# Patient Record
Sex: Male | Born: 1980
Health system: Southern US, Community
[De-identification: ages and names within clinical notes are randomized; demographics above are authoritative.]

## PROBLEM LIST (undated history)

## (undated) DIAGNOSIS — F419 Anxiety disorder, unspecified: Secondary | ICD-10-CM

## (undated) DIAGNOSIS — I10 Essential (primary) hypertension: Secondary | ICD-10-CM

## (undated) HISTORY — PX: TONSILLECTOMY: SHX5217

## (undated) HISTORY — DX: Anxiety disorder, unspecified: F41.9

## (undated) HISTORY — PX: BACK SURGERY: SHX140

---
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
## 2007-11-14 ENCOUNTER — Ambulatory Visit: Payer: Self-pay | Admitting: Occupational Medicine

## 2007-11-14 DIAGNOSIS — F411 Generalized anxiety disorder: Secondary | ICD-10-CM | POA: Insufficient documentation

## 2007-11-18 ENCOUNTER — Ambulatory Visit: Payer: Self-pay | Admitting: Family Medicine

## 2007-11-18 DIAGNOSIS — R1011 Right upper quadrant pain: Secondary | ICD-10-CM | POA: Insufficient documentation

## 2007-11-18 DIAGNOSIS — R030 Elevated blood-pressure reading, without diagnosis of hypertension: Secondary | ICD-10-CM | POA: Insufficient documentation

## 2007-11-19 LAB — CONVERTED CEMR LAB
ALT: 12 units/L (ref 0–35)
AST: 19 units/L (ref 0–37)
Albumin: 5 g/dL (ref 3.5–5.2)
Alkaline Phosphatase: 61 units/L (ref 39–117)
BUN: 22 mg/dL (ref 6–23)
CO2: 24 meq/L (ref 19–32)
Calcium: 9.7 mg/dL (ref 8.4–10.5)
Chloride: 104 meq/L (ref 96–112)
Creatinine, Ser: 1.05 mg/dL (ref 0.40–1.20)
Glucose, Bld: 94 mg/dL (ref 70–99)
Potassium: 4.5 meq/L (ref 3.5–5.3)
Sodium: 139 meq/L (ref 135–145)
TSH: 1.685 microintl units/mL (ref 0.350–4.50)
Total Bilirubin: 0.8 mg/dL (ref 0.3–1.2)
Total Protein: 7.7 g/dL (ref 6.0–8.3)

## 2008-03-09 ENCOUNTER — Ambulatory Visit: Payer: Self-pay | Admitting: Family Medicine

## 2008-03-09 DIAGNOSIS — G5600 Carpal tunnel syndrome, unspecified upper limb: Secondary | ICD-10-CM | POA: Insufficient documentation

## 2008-05-23 ENCOUNTER — Ambulatory Visit: Payer: Self-pay | Admitting: Family Medicine

## 2008-12-23 ENCOUNTER — Ambulatory Visit: Payer: Self-pay | Admitting: Family Medicine

## 2008-12-23 DIAGNOSIS — J019 Acute sinusitis, unspecified: Secondary | ICD-10-CM | POA: Insufficient documentation

## 2009-01-11 ENCOUNTER — Ambulatory Visit: Payer: Self-pay | Admitting: Family Medicine

## 2009-01-11 DIAGNOSIS — A088 Other specified intestinal infections: Secondary | ICD-10-CM | POA: Insufficient documentation

## 2009-02-04 ENCOUNTER — Ambulatory Visit: Payer: Self-pay | Admitting: Family Medicine

## 2009-02-04 DIAGNOSIS — M545 Low back pain, unspecified: Secondary | ICD-10-CM | POA: Insufficient documentation

## 2009-02-24 ENCOUNTER — Encounter: Payer: Self-pay | Admitting: Family Medicine

## 2009-05-30 ENCOUNTER — Ambulatory Visit: Payer: Self-pay | Admitting: Family Medicine

## 2009-05-30 DIAGNOSIS — H6690 Otitis media, unspecified, unspecified ear: Secondary | ICD-10-CM | POA: Insufficient documentation

## 2009-05-30 DIAGNOSIS — J0100 Acute maxillary sinusitis, unspecified: Secondary | ICD-10-CM | POA: Insufficient documentation

## 2010-01-31 NOTE — Assessment & Plan Note (Signed)
Summary: Left carpel tunnel.    Vital Signs:  Patient profile:   30 year old male Height:      72 inches Weight:      242 pounds Pulse rate:   73 / minute BP sitting:   138 / 75  (left arm) Cuff size:   large  Vitals Entered By: Kathlene November (March 09, 2008 4:08 PM) Is Patient Diabetic? No   History of Present Illness: Left wrist pain/numbness. Numbness worse at night. Goes niumb with lifting heavy laundry bags during the day. Affects sometimes the finger tips a nd sometimes the whole hand.  No alleviating sxs. Started about 2 weeks ago.  Getting worse.  No  OTC medication.  No injuries or trauma. No prior hx of similar sxs. Right handed. No shoulder, neck or elbow pain. No swelling of the joints.  No HA or other neurologic signs.  No CP.    Current Medications (verified): 1)  None  Allergies (verified): No Known Drug Allergies  Physical Exam  General:  Well-developed,well-nourished,in no acute distress; alert,appropriate and cooperative throughout examination Head:  Normocephalic and atraumatic without obvious abnormalities. No apparent alopecia or balding. Eyes:  No corneal or conjunctival inflammation noted. EOMI. Perrla. Funduscopic exam benign, without hemorrhages, exudates or papilledema. Vision grossly normal. Msk:  Left wrsit with NROM.  Strength 5/5 in the wrist, fingers, elbow, and shoulder.  Neck with NROMl.  Shoulder with NROM. Elbow and wrist are nontender. Neg tinnels sign.  + phalens sign, caused numbnes in his fingertips on the left. NO swelling or rednesss of the joints.  Pulses:  Radial 2+ bilat.  Extremities:  No jiont swelling or erythema.  Neurologic:  alert & oriented X3 and gait normal.   Skin:  no rashes.   Psych:  Cognition and judgment appear intact. Alert and cooperative with normal attention span and concentration. No apparent delusions, illusions, hallucinations   Impression & Recommendations:  Problem # 1:  CARPAL TUNNEL SYNDROME, LEFT  (ICD-354.0) Assessment New Discussed treatment with splints at night. Ibuprofen 400mg  three times a day for one week. FU with Dr. B in 3 weeks to recheck and see if he is improving.  Discussed dx with him. Fitted for an cock up splint -XL.   Orders: Wrist Splint Cock Up [phone])

## 2010-01-31 NOTE — Letter (Signed)
Summary: Disclosure of medical information  Disclosure of medical information   Imported By: Shelbie Proctor 02/24/2009 16:54:10  _____________________________________________________________________  External Attachment:    Type:   Image     Comment:   External Document

## 2010-01-31 NOTE — Letter (Signed)
Summary: Out of Work  [facility]  [address]   [place], [place] [address]   Phone: [phone]  Fax: [phone]    February 04, 2009   Employee:  [employer]    To Whom It May Concern:   For Medical reasons, please excuse the above named employee from heavy lifting, pushing, pulling, and straining for one week (light duty).    If you need additional information, please feel free to contact our office.         Sincerely,    Donna Christen MD

## 2010-01-31 NOTE — Assessment & Plan Note (Signed)
Summary: CONGESTION, HEAD PRESSURE, BODY ACHES, FEVER, SORE THROAT   Vital Signs:  Patient Profile:   30 Years Old Male CC:      congestion, head pressusre, body aches, fever, sore throat Height:     72 inches Weight:      225.5 pounds O2 Sat:      96 % Temp:     99.5 degrees F oral Pulse rate:   106 / minute BP sitting:   141 / 83  (left arm) Cuff size:   regular  Vitals Entered By: Angie Fava (May 23, 2008 1:11 PM)                  Updated Prior Medication List: No Medications Current Allergies: No known allergies  History of Present Illness Chief Complaint: congestion, head pressusre, body aches, fever, sore throat History of Present Illness: ONSET  YESTERDAY WITH SCRATCHY THROAT. WORSENED OVERNIGHT. FEVER LOW GRADE. SINUS PRESSURE, OCC COUGH. NO EAR PAIN. DOES HAVE SOME BODYACHES.   REVIEW OF SYSTEMS Constitutional Symptoms       Complains of fever.     Denies chills, night sweats, weight loss, weight gain, and fatigue.  Eyes       Denies change in vision, eye pain, eye discharge, glasses, contact lenses, and eye surgery. Ear/Nose/Throat/Mouth       Complains of sore throat.      Denies hearing loss/aids, change in hearing, ear pain, ear discharge, dizziness, frequent runny nose, frequent nose bleeds, sinus problems, hoarseness, and tooth pain or bleeding.  Respiratory       Denies dry cough, productive cough, wheezing, shortness of breath, asthma, bronchitis, and emphysema/COPD.  Cardiovascular       Denies murmurs, chest pain, and tires easily with exhertion.    Gastrointestinal       Denies stomach pain, nausea/vomiting, diarrhea, constipation, blood in bowel movements, and indigestion. Genitourniary       Denies painful urination, kidney stones, and loss of urinary control. Neurological       Denies paralysis, seizures, and fainting/blackouts. Musculoskeletal       Complains of muscle pain.      Denies joint pain, joint stiffness, decreased range of  motion, redness, swelling, muscle weakness, and gout.  Skin       Denies bruising, unusual mles/lumps or sores, and hair/skin or nail changes.  Psych       Denies mood changes, temper/anger issues, anxiety/stress, speech problems, depression, and sleep problems.   Physical Exam General appearance: well developed, well nourished, no acute distress Head: normocephalic, atraumatic Eyes: conjunctivae and lids normal Ears: normal, no lesions or deformities Nasal: CONGESTED Oral/Pharynx: POSTERIOR PHARYNX INFLAMED Neck: neck supple,  trachea midline, no masses Chest/Lungs: no rales, wheezes, or rhonchi bilateral, breath sounds equal without effort Heart: regular rate and  rhythm, no murmur Skin: no obvious rashes or lesions    Assessment New Problems: UPPER RESPIRATORY INFECTION, ACUTE (ICD-465.9)   Plan New Medications/Changes: RONDEC DM 1-2 TSP by mouth Q 6 HRS as needed COUGH AND CONGESTION  #4 OZ x 0, 05/23/2008, Marvis Moeller DO AMOXICILLIN 500 MG 1 TAB by mouth three times a day  ##30 x 0, 05/23/2008, Marvis Moeller DO  New Orders: Est. Patient Level III [04540]     Prescriptions: RONDEC DM 1-2 TSP by mouth Q 6 HRS as needed COUGH AND CONGESTION  #4 OZ x 0   Entered and Authorized by:   Marvis Moeller DO   Signed by:   Marvis Moeller  DO on 05/23/2008   Method used:   Print then Give to Patient   RxID:   [id number] AMOXICILLIN 500 MG 1 TAB by mouth three times a day  ##30 x 0   Entered and Authorized by:   Marvis Moeller DO   Signed by:   Marvis Moeller DO on 05/23/2008   Method used:   Print then Give to Patient   RxID:   [id number]    Patient Instructions: 1)  TYLENOL OR MOTRIN AS NEEDED. AVOID CAFFEINE AND MILK PRODUCTS. FOLLOW UP WITH PCP OR RETURN IF SYMPTOMS FAIL TO RESOLVE OR WORSEN ] ]

## 2010-01-31 NOTE — Assessment & Plan Note (Signed)
Summary: RUNNY NOSE,SINUS Pressure, eye drainage x 3 dys rm 2   Vital Signs:  Patient Profile:   30 Years Old Male CC:      Cold & URI symptoms Height:     72 inches Weight:      250 pounds O2 Sat:      98 % O2 treatment:    Room Air Temp:     98.2 degrees F oral Pulse rate:   85 / minute Pulse rhythm:   regular Resp:     16 per minute BP sitting:   139 / 90  (right arm) Cuff size:   regular  Vitals Entered By: Areta Haber CMA (May 30, 2009 11:51 AM)                  Current Allergies: No known allergies History of Present Illness Chief Complaint: Cold & URI symptoms History of Present Illness: Subjective: Patient complains of URI symptoms that started 3 days ago with sinus congestion, and now has right earache and facial pressure. Minimal sore throat. No cough No pleuritic pain No wheezing + post-nasal drainage No itchy/red eyes No hemoptysis No SOB No fever/chills No nausea No vomiting No abdominal pain No diarrhea No skin rashes + fatigue No myalgias No headache Used OTC meds without relief   Current Problems: ACUTE MAXILLARY SINUSITIS (ICD-461.0) ROM (ICD-382.9) BACK PAIN, LUMBAR (ICD-724.2) GASTROENTERITIS, VIRAL, ACUTE (ICD-008.8) ACUTE SINUSITIS, UNSPECIFIED (ICD-461.9) CARPAL TUNNEL SYNDROME, LEFT (ICD-354.0) ABDOMINAL PAIN RIGHT UPPER QUADRANT (ICD-789.01) ELEVATED BLOOD PRESSURE WITHOUT DIAGNOSIS OF HYPERTENSION (ICD-796.2) ANXIETY (ICD-300.00)   Current Meds HYDROCODONE-ACETAMINOPHEN 5-500 MG TABS (HYDROCODONE-ACETAMINOPHEN) 1-2 as needed for pain from Back Surgery AMOXICILLIN 875 MG TABS (AMOXICILLIN) One by mouth two times a day BENZONATATE 200 MG CAPS (BENZONATATE) One by mouth hs as needed cough  REVIEW OF SYSTEMS Constitutional Symptoms      Denies fever, chills, night sweats, weight loss, weight gain, and fatigue.  Eyes       Complains of eye drainage.      Denies change in vision, eye pain, glasses, contact lenses, and eye  surgery. Ear/Nose/Throat/Mouth       Complains of frequent runny nose and sinus problems.      Denies hearing loss/aids, change in hearing, ear pain, ear discharge, dizziness, frequent nose bleeds, sore throat, hoarseness, and tooth pain or bleeding.      Comments: x 3 dys -clear Respiratory       Denies dry cough, productive cough, wheezing, shortness of breath, asthma, bronchitis, and emphysema/COPD.  Cardiovascular       Denies murmurs, chest pain, and tires easily with exhertion.    Gastrointestinal       Denies stomach pain, nausea/vomiting, diarrhea, constipation, blood in bowel movements, and indigestion. Genitourniary       Denies painful urination, kidney stones, and loss of urinary control. Neurological       Complains of headaches.      Denies paralysis, seizures, and fainting/blackouts. Musculoskeletal       Denies muscle pain, joint pain, joint stiffness, decreased range of motion, redness, swelling, muscle weakness, and gout.  Skin       Denies bruising, unusual mles/lumps or sores, and hair/skin or nail changes.  Psych       Denies mood changes, temper/anger issues, anxiety/stress, speech problems, depression, and sleep problems. Other Comments: Pt has not seen PCP for this.   Past History:  Past Medical History: Last updated: 11/18/2007 Anxiety Flex Sig normal 12-08  Past Surgical History:  Last updated: 2007-11-26 tonsillectomy age 27  Family History: Last updated: Nov 26, 2007 Mother died, complications of gastric bypass sister healthy father alive, DM, high chol, HTN MGF AMI > 31  Social History: Last updated: 02/04/2009 Married  Never Smoked Alcohol use-yes,socially Drug use-no Regular exercise-no has a toddler son weight increase 30 lbs in 2 yrs. 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  Extraocular movement is intact.  Conjunctivae are not inflamed.  Ears:  Canals normal.   Left tympanic membrane normal.  Right tympanic membrane red and bulging. Nose:  Normal septum.  Normal turbinates, mildly congested.  Right maxillary sinus tenderness present.  Pharynx:  Normal  Neck:  Supple.  No adenopathy is present.  No thyromegaly is present  Lungs:  Clear to auscultation.  Breath sounds are equal.  Heart:  Regular rate and rhythm without murmurs, rubs, or gallops.  Abdomen:  Nontender without masses or hepatosplenomegaly.  Bowel sounds are present.  No CVA or flank tenderness.  Assessment New Problems: ACUTE MAXILLARY SINUSITIS (ICD-461.0) ROM (ICD-382.9)  SUSPECT EARLY VIRAL URI  Plan New Medications/Changes: BENZONATATE 200 MG CAPS (BENZONATATE) One by mouth hs as needed cough  #12 x 0, 05/30/2009, Donna Christen MD AMOXICILLIN 875 MG TABS (AMOXICILLIN) One by mouth two times a day  #20 x 0, 05/30/2009, Donna Christen MD  New Orders: Est. Patient Level III [phone] Planning Comments:   Begin amoxicillin, decongestant/expectorant, cough suppressant at night. Follow-up with PCP if not improving.   The patient and/or caregiver has been counseled thoroughly with regard to medications prescribed including dosage, schedule, interactions, rationale for use, and possible side effects and they verbalize understanding.  Diagnoses and expected course of recovery discussed and will return if not improved as expected or if the condition worsens. Patient and/or caregiver verbalized understanding.  Prescriptions: BENZONATATE 200 MG CAPS (BENZONATATE) One by mouth hs as needed cough  #12 x 0   Entered and Authorized by:   Donna Christen MD   Signed by:   Donna Christen MD on 05/30/2009   Method used:   Print then Give to Patient   RxID:   [id number] AMOXICILLIN 875 MG TABS (AMOXICILLIN) One by mouth two times a day  #20 x 0    Entered and Authorized by:   Donna Christen MD   Signed by:   Donna Christen MD on 05/30/2009   Method used:   Print then Give to Patient   RxID:   [id number]   Patient Instructions: 1)  Take  Mucinex D (guaifenesin with decongestant) twice daily for congestion. 2)  Increase fluid intake, rest. 3)  May use Afrin nasal spray (or generic oxymetazoline) twice daily for about 5 days.  Also recommend using saline nasal spray several times daily and/or saline nasal irrigation. 4)  Stop Sinex  5)  Followup with family doctor if not improving one week.

## 2010-01-31 NOTE — Assessment & Plan Note (Signed)
Summary: BLOOD PRESSURE/KH   Vital Signs:  Patient Profile:   30 Years Old Male CC:      Hypertension Management Height:     72 inches Weight:      230 pounds O2 Sat:      99 % Temp:     98.7 degrees F Pulse rate:   70 / minute Resp:     16 per minute BP sitting:   158 / 93  (left arm) Cuff size:   regular  Pt. in pain?   yes    Location:   head    Intensity:   4    Type:       dull  Vitals Entered By: Areta Haber CMA (November 14, 2007 4:52 PM)                   Current Allergies: No known allergies  History of Present Illness Chief Complaint: Hypertension Management History of Present Illness: 30 yo male had an episode of near syncope earlier this week.  He stopped at an FD and had his BP taken 202/140 at that time.  He was taken to the [facility] regional ED and he was treated with lorazepam #3.  Pt was instructed to return to ED if he feels the way he did before.  Pt had an appointment today to see his PCM but missed the appointment because of wrong time.  Pt states he feels like his breathing changes during his work day.  Pt states he has a lot of stress with work.  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  Pt will feel 'twitching of his muscles' and flushing of his face at work.  Pt denies working with any chemicals.  States he feels a lot better during the weekends.  Pt denies any recent weight gain or loss.  Pt states he feels more 'wron out' than he has in the past.  Pt states he has no symptoms at this time and is looking for a primary care provide.     All: nkda   REVIEW OF SYSTEMS Constitutional Symptoms      Denies fever, chills, night sweats, weight loss, weight gain, and fatigue.  Eyes       Denies change in vision, eye pain, eye discharge, glasses, contact lenses, and eye surgery. Ear/Nose/Throat/Mouth       Denies hearing loss/aids, change in hearing, ear pain, ear discharge, dizziness, frequent runny nose, frequent  nose bleeds, sinus problems, sore throat, hoarseness, and tooth pain or bleeding.  Respiratory       Denies dry cough, productive cough, wheezing, shortness of breath, asthma, bronchitis, and emphysema/COPD.  Cardiovascular       Complains of chest pain.      Denies murmurs and tires easily with exhertion.    Gastrointestinal       Denies stomach pain, nausea/vomiting, diarrhea, constipation, blood in bowel movements, and indigestion. Genitourniary       Denies painful urination, kidney stones, and loss of urinary control. Neurological       Denies paralysis, seizures, and fainting/blackouts. Musculoskeletal       Denies muscle pain, joint pain, joint stiffness, decreased range of motion, redness, swelling, muscle weakness, and gout.  Skin       Denies bruising, unusual mles/lumps or sores, and hair/skin or nail changes.  Psych       Complains of anxiety/stress.      Denies mood changes, temper/anger issues,  speech problems, depression, and sleep problems.  Past Medical History:        Anxiety  Past Surgical History:    Denies surgical history   Family History:    Family History Hypertension    Family History of Cardiovascular disorder  Social History:    Married    Never Smoked    Alcohol use-yes,socially    Drug use-no    Regular exercise-yes  Physical Exam General appearance: well developed, well nourished, no acute distress Head: normocephalic, atraumatic Eyes: conjunctivae and lids normal Oral/Pharynx: tongue normal, posterior pharynx without erythema or exudate Chest/Lungs: no rales, wheezes, or rhonchi bilateral, breath sounds equal without effort Heart: regular rate and  rhythm, no murmur Abdomen: soft, non-tender without obvious organomegaly Neurological: grossly intact and non-focal Skin: no obvious rashes or lesions MSE: oriented to time, place, and person    Assessment New Problems: HYPERTENSION (ICD-401.1) ANXIETY (ICD-300.00)   Plan New  Orders: New Patient Level III [16109] Planning Comments:   1.  Pt given Dr. Cathey Endow contact information and will make appt for evaluation of chronic htn and anxiety, consider thyroid evaluation. 2.  Discussed possible treatment options for anxiety ie; counceling, medication etc. 3.  Pt may use remainder of lorazepam for symptomatic relief. 4.  return to clinic as needed.   The patient and/or caregiver has been counseled thoroughly with regard to medications prescribed including dosage, schedule, interactions, rationale for use, and possible side effects and they verbalize understanding.  Diagnoses and expected course of recovery discussed and will return if not improved as expected or if the condition worsens. Patient and/or caregiver verbalized understanding.     ] ]

## 2010-01-31 NOTE — Assessment & Plan Note (Signed)
Summary: NOV elevated BP/ anxiety/ abd pain   Vital Signs:  Patient Profile:   30 Years Old Male Height:     72 inches Weight:      228.25 pounds BMI:     31.07 Pulse rate:   64 / minute BP sitting:   132 / 71  (left arm) Cuff size:   large  Vitals Entered By: Kathlene November (November 18, 2007 11:05 AM)                 PCP:  Seymour Bars DO  Chief Complaint:  NOV FU FROM UC.  History of Present Illness: 30 yo WM seen for NOV.    Went to the ED recently for R sided recurrent abdominal pain and was having a panic attack during his visit w/ a BP of 202/145.  No previous hx of HTN.  Reports having this pain in his R periumbilical region on and off since Dec 2008.  Denies change in BMs, blood in stool or melena.  Had a normal flex sig.  No N/V/D/C.  No radiation of pain.  No change in appetite or weight.  For mood, he was given Lorazepam as needed and has used all 3 tabs.  Under more stress at work, working 60 hrs/ wk.  Sleeps well 8 hrs at night.  No previous hx of depression or anxiety.    Current Allergies: No known allergies   Past Medical History:    Anxiety    Flex Sig normal 12-08  Past Surgical History:    tonsillectomy age 68   Family History:    Mother died, complications of gastric bypass    sister healthy    father alive, DM, high chol, HTN    MGF AMI > 28  Social History:    Married     Never Smoked    Alcohol use-yes,socially    Drug use-no    Regular exercise-yes    has a toddler son    weight increase 30 lbs in 2 yrs.   Risk Factors:  Caffeine use:  2 drinks per day  Family History Risk Factors:    Family History of MI in females < 53 years old:  no    Family History of MI in males < 62 years old:  no   Review of Systems       no fevers/sweats/weakness, unexplained wt loss/gain, no change in vision, no difficulty hearing, ringing in ears, no hay fever/allergies, no CP/discomfort, no palpitations, no breast lump/nipple discharge, no  cough/wheeze, no blood in stool, no N/V/D, no nocturia, no leaking urine, no unusual vag bleeding, no vaginal/penile discharge, +muscle/joint pain, no rash, no new/changing mole, + HA, no memory loss, + anxiety, no sleep problem, no depression, + unexplained lumps, no easy bruising/bleeding, no concern with sexual function    Physical Exam  General:     alert, well-developed, well-nourished, well-hydrated, and overweight-appearing.  here with wife Head:     normocephalic and atraumatic.   Eyes:     pupils equal, pupils round, and pupils reactive to light.   Nose:     no nasal discharge.   Mouth:     good dentition and pharynx pink and moist.  low hanging uvula Neck:     no masses.  neck fullness with globally limited ROM, tight trap muscles Lungs:     Normal respiratory effort, chest expands symmetrically. Lungs are clear to auscultation, no crackles or wheezes. Heart:     Normal rate  and regular rhythm. S1 and S2 normal without gallop, murmur, click, rub or other extra sounds. Abdomen:     soft, non-tender, normal bowel sounds, no distention, no masses, no guarding, no rigidity, no rebound tenderness, no abdominal hernia, no hepatomegaly, and no splenomegaly.   Extremities:     no E/C/C Skin:     color normal.   Cervical Nodes:     No lymphadenopathy noted Psych:     flat affect.      Impression & Recommendations:  Problem # 1:  ABDOMINAL PAIN RIGHT UPPER QUADRANT (ICD-789.01) Chronic, recurrent with no sign of acute abdomen or worrisome features.  Likely IBS or gas pains.  Check labs today.  Watch dietary intake and recheck at 4 wk f/u.  Reassured by normal w/u thus far. Orders: T-Comprehensive Metabolic Panel ([id number])   Problem # 2:  ANXIETY (ICD-300.00) Assessment: Deteriorated Increased stress leading to anxiety and panic attacks.  Explained that many of his somatic complaints are likely related to his anxiety.  Agress to start treatment with Fluoxetine daily w/  as needed use of Xanax.  Consider counseling.  F/U in 4 wks. His updated medication list for this problem includes:    Alprazolam 0.5 Mg Tabs (Alprazolam) .Marland Kitchen... 1/2 tab by mouth two times a day as needed anxiety    Fluoxetine Hcl 10 Mg Tabs (Fluoxetine hcl) .Marland Kitchen... 1 tab by mouth daily   Problem # 3:  ELEVATED BLOOD PRESSURE WITHOUT DIAGNOSIS OF HYPERTENSION (ICD-796.2) BP <140/90 today.  Will continue to monitor and r/o primary cause of elevated BP. Orders: T-TSH ([id number])  BP today: 132/71 Prior BP: 158/93 (11/14/2007)  Instructed in low sodium diet (DASH Handout) and behavior modification.    Complete Medication List: 1)  Alprazolam 0.5 Mg Tabs (Alprazolam) .... 1/2 tab by mouth two times a day as needed anxiety 2)  Fluoxetine Hcl 10 Mg Tabs (Fluoxetine hcl) .Marland Kitchen.. 1 tab by mouth daily    Prescriptions: FLUOXETINE HCL 10 MG TABS (FLUOXETINE HCL) 1 tab by mouth daily  #30 x 1   Entered and Authorized by:   Seymour Bars DO   Signed by:   Seymour Bars DO on 11/18/2007   Method used:   Print then Give to Patient   RxID:   [id number] ALPRAZOLAM 0.5 MG TABS (ALPRAZOLAM) 1/2 tab by mouth two times a day as needed anxiety  #30 x 0   Entered and Authorized by:   Seymour Bars DO   Signed by:   Seymour Bars DO on 11/18/2007   Method used:   Print then Give to Patient   RxID:   [id number]  ]

## 2010-01-31 NOTE — Assessment & Plan Note (Signed)
Summary: BACK PAIN INTO LEFT LEG/Louis Phillips   Vital Signs:  Patient Profile:   30 Years Old Male CC:      lower back and left leg pain Height:     72 inches Weight:      242 pounds O2 Sat:      98 % O2 treatment:    Room Air Pulse rate:   109 / minute Resp:     20 per minute BP sitting:   148 / 89  Pt. in pain?   yes    Location:   back/left leg    Intensity:   8    Type:       sharp  Vitals Entered By: Lita Mains, Rn                   Current Allergies (reviewed today): No known allergies History of Present Illness History from: patient Chief Complaint: lower back and left leg pain History of Present Illness: Subjective:  Patient complains of onset of low back pain last August that lasted until October as he was undergoing chiropractic treatment.  The pain had improved until 4 days ago when it increased, and began to radiate to his left buttock and lateral leg.  His pain is worse when he stands from sitting, and coughs.  The radiating pain is sharp and brief.  The pain is improved when he sits, and he has no difficulty sleeping at night.  His job involves lifting heavy bags of laundry, but he recalls no recent trauma or change in activities.  No bowel or bladder dysfunction.  No saddle numbness.  No fevers, chills, and sweats.  No weight loss.  REVIEW OF SYSTEMS       Musculoskeletal       Complains of muscle pain and joint pain.      Comments: lower back and left leg pain  Other Comments: symptoms X 3 days   Past History:  Past Medical History: Reviewed history from 11/18/2007 and no changes required. Anxiety Flex Sig normal 12-08  Past Surgical History: Reviewed history from 11/18/2007 and no changes required. tonsillectomy age 79  Family History: Reviewed history from 11/18/2007 and no changes required. Mother died, complications of gastric bypass sister healthy father alive, DM, high chol, HTN MGF AMI > 34  Social History: Married  Never Smoked Alcohol  use-yes,socially Drug use-no Regular exercise-no has a toddler son weight increase 30 lbs in 2 yrs. 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  No adenopathy is present.   Lungs:  Clear to auscultation.  Breath sounds are equal.  Heart:  Regular rate and rhythm without murmurs, rubs, or gallops.  Abdomen:  Nontender without masses or hepatosplenomegaly.  Bowel sounds are present.  No CVA or flank tenderness.   Back:  Good range of motion.  Can heel/toe walk and squat without difficulty.   No distinct tenderness to palpation.   Straight leg raising test is negative.  Sitting knee extension test is negative.  Strength and sensation in the lower extremities is normal.  Patellar and achilles reflexes are normal.  LS spine X-ray:  negative Assessment New Problems: BACK PAIN, LUMBAR (ICD-724.2)   Plan New Medications/Changes: TRAMADOL HCL 50 MG TABS (TRAMADOL HCL) One or two tabs by mouth, 2 or 3 times daily as needed for pain  #20 x 1, 02/04/2009, Donna Christen MD NAPROXEN 500 MG TABS (NAPROXEN) One by mouth two times a day pc  #  20 x 1, 02/04/2009, Donna Christen MD  New Orders: T-Lumbar Spine Complete, 5 Views [71110TC] Est. Patient Level III [78295] Planning Comments:   Begin ice packs, Naproxen, tramadol as needed.  Light duty for one week.  Begin back exercises ([facility] information and instruction patient handout given)  Follow-up with PCP if not improving 10 to 14 days.   The patient and/or caregiver has been counseled thoroughly with regard to medications prescribed including dosage, schedule, interactions, rationale for use, and possible side effects and they verbalize understanding.  Diagnoses and expected course of recovery discussed and will return if not improved as expected or if the condition worsens. Patient and/or caregiver verbalized understanding.  Prescriptions: TRAMADOL HCL 50 MG TABS (TRAMADOL HCL) One or two tabs by mouth, 2 or 3  times daily as needed for pain  #20 x 1   Entered and Authorized by:   Donna Christen MD   Signed by:   Donna Christen MD on 02/04/2009   Method used:   Print then Give to Patient   RxID:   [id number] NAPROXEN 500 MG TABS (NAPROXEN) One by mouth two times a day pc  #20 x 1   Entered and Authorized by:   Donna Christen MD   Signed by:   Donna Christen MD on 02/04/2009   Method used:   Print then Give to Patient   RxID:   [id number]

## 2010-01-31 NOTE — Assessment & Plan Note (Signed)
Summary: Diarrhea,fever, headache x this am rm 3   Vital Signs:  Patient Profile:   30 Years Old Male CC:      Cold & URI symptoms Height:     72 inches Weight:      248 pounds O2 Sat:      100 % O2 treatment:    Room Air Temp:     99.3 degrees F oral Pulse rate:   106 / minute Pulse rhythm:   irregular Resp:     16 per minute BP sitting:   134 / 83  (right arm) Cuff size:   regular  Vitals Entered By: Areta Haber CMA (January 11, 2009 6:16 PM)                  Current Allergies: No known allergies History of Present Illness History from: pt Chief Complaint: Cold & URI symptoms History of Present Illness: onset of n/v/d this am, last episode approx 3 hrs ago, did go to work today in spite of signs. other family members with same  Current Problems: GASTROENTERITIS (ICD-558.9) GASTROENTERITIS, VIRAL, ACUTE (ICD-008.8) ACUTE SINUSITIS, UNSPECIFIED (ICD-461.9) CARPAL TUNNEL SYNDROME, LEFT (ICD-354.0) ABDOMINAL PAIN RIGHT UPPER QUADRANT (ICD-789.01) ELEVATED BLOOD PRESSURE WITHOUT DIAGNOSIS OF HYPERTENSION (ICD-796.2) ANXIETY (ICD-300.00)   Current Meds * ZOFRAN 4MG  ODT 1 tab sl q6h as needed n/v  REVIEW OF SYSTEMS Constitutional Symptoms       Complains of fever and fatigue.     Denies chills, night sweats, weight loss, and weight gain.  Eyes       Denies change in vision, eye pain, eye discharge, glasses, contact lenses, and eye surgery. Ear/Nose/Throat/Mouth       Denies hearing loss/aids, change in hearing, ear pain, ear discharge, dizziness, frequent runny nose, frequent nose bleeds, sinus problems, sore throat, hoarseness, and tooth pain or bleeding.  Respiratory       Denies dry cough, productive cough, wheezing, shortness of breath, asthma, bronchitis, and emphysema/COPD.  Cardiovascular       Denies murmurs, chest pain, and tires easily with exhertion.    Gastrointestinal       Complains of stomach pain, nausea/vomiting, and diarrhea.      Denies  constipation, blood in bowel movements, and indigestion. Genitourniary       Denies painful urination, kidney stones, and loss of urinary control. Neurological       Complains of headaches and weakness.      Denies loss of or changes in sensation, numbness, tngling, tremors, paralysis, seizures, and fainting/blackouts. Musculoskeletal       Denies muscle pain, joint pain, joint stiffness, decreased range of motion, redness, swelling, muscle weakness, and gout.  Skin       Denies bruising, unusual mles/lumps or sores, and hair/skin or nail changes.  Psych       Denies mood changes, temper/anger issues, anxiety/stress, speech problems, depression, and sleep problems. Other Comments: x this am. Pt has not taken anything for diarrhea or fever.   Past History:  Past Medical History: Last updated: 11/20/2007 Anxiety Flex Sig normal 12-08  Past Surgical History: Last updated: Nov 20, 2007 tonsillectomy age 37  Family History: Last updated: 2007-11-20 Mother died, complications of gastric bypass sister healthy father alive, DM, high chol, HTN MGF AMI > 9  Social History: Last updated: 12/23/2008 Married  Never Smoked Alcohol use-yes,socially Drug use-no Regular exercise-no has a toddler son weight increase 30 lbs in 2 yrs.  Risk Factors: Caffeine Use: 2 (11/20/2007) Exercise: yes (11/14/2007)  Risk  Factors: Smoking Status: never (11/14/2007) Physical Exam General appearance: well developed, well nourished, no acute distress Head: normocephalic, atraumatic Oral/Pharynx: tongue normal, posterior pharynx without erythema or exudate, moist membranes Neck: neck supple,  trachea midline, no masses, no adenopathy Abdomen: soft, non-tender without obvious organomegaly, bs active Neurological: grossly intact and non-focal Skin: no obvious rashes or lesions MSE: oriented to time, place, and person Assessment New Problems: GASTROENTERITIS (ICD-558.9) GASTROENTERITIS, VIRAL,  ACUTE (ICD-008.8)   Plan New Medications/Changes: ZOFRAN 4MG  ODT 1 tab sl q6h as needed n/v  #10 x 0, 01/11/2009, Linna Hoff MD  New Orders: New Patient Level III [id number] Zofran 1mg . injection [J2405] Admin of Therapeutic Inj  intramuscular or subcutaneous [96372]  The patient and/or caregiver has been counseled thoroughly with regard to medications prescribed including dosage, schedule, interactions, rationale for use, and possible side effects and they verbalize understanding.  Diagnoses and expected course of recovery discussed and will return if not improved as expected or if the condition worsens. Patient and/or caregiver verbalized understanding.  Prescriptions: ZOFRAN 4MG  ODT 1 tab sl q6h as needed n/v  #10 x 0   Entered and Authorized by:   Linna Hoff MD   Signed by:   Linna Hoff MD on 01/11/2009   Method used:   Print then Give to Patient   RxID:   [id number]   Patient Instructions: 1)  Please schedule a follow-up appointment as needed .  2)  Drink clear liquids only for the next 24 hours, then slowly add other liquids and food as you tolerate them . 3)  Oral rehydration solution: Drink 1/2 ounce every 15 minutes. If tolerated after 1 hour, drink 1 ounce every 15 minutes. As you  can tolerate, keep adding 1/2 ounce every 15 minutes, up to a total or 2-4 ounces. Contact the office if unable to tolerate oral solution, if you keep vomiting, or you continue to have signs of dehydration. 4)  The main problem with gastroentereritis is dehydration. Drink plenty of fluids and take solids as you feel better. If you are unable to keep anything down and/or you show signs of dehydration( dry cracked lips, lack of tears, not urinating, very sleepy) , call our office.   Impression & Recommendations:  Problem # 1:  GASTROENTERITIS, VIRAL, ACUTE (ICD-008.8)  Complete Medication List: 1)  Zofran 4mg  Odt  .Marland Kitchen.. 1 tab sl q6h as needed n/v  Other Orders: Zofran 1mg . injection  (O5366) Admin of Therapeutic Inj  intramuscular or subcutaneous (44034)   Medication Administration  Injection # 1:    Medication: Zofran 1mg . injection    Diagnosis: GASTROENTERITIS (ICD-558.9)    Route: IM    Site: RUOQ gluteus    Exp Date: 06/30/2009    Lot #: [id number]    Mfr: Sicor    Comments: Administered 4mg     Patient tolerated injection without complications    Given by: Areta Haber CMA (January 11, 2009 6:42 PM)  Orders Added: 1)  New Patient Level III [99203] 2)  Zofran 1mg . injection [J2405] 3)  Admin of Therapeutic Inj  intramuscular or subcutaneous [63875]

## 2010-01-31 NOTE — Assessment & Plan Note (Signed)
Summary: SORE THROAT, CONGESTION/WB   Vital Signs:  Patient Profile:   31 Years Old Male CC:      sore throat, productive cough, runny nose, fatigue Height:     72 inches Weight:      248 pounds O2 Sat:      97 % Temp:     97.8 degrees F oral Pulse rate:   79 / minute Resp:     20 per minute BP sitting:   145 / 96  (right arm)  Vitals Entered By: Lita Mains                  Current Allergies: No known allergies History of Present Illness Chief Complaint: sore throat, productive cough, runny nose, fatigue History of Present Illness: SINUS CONGESTION OFF AND ON FOR THE PAST 2-3 WKS. FACIAL PRESSURE. SORE THROAT IN THE EVENINGS. NO COUG, NO FEVER. TAKING MUCINEX AND ALKASELZER.   REVIEW OF SYSTEMS Constitutional Symptoms       Complains of fatigue.     Denies fever, chills, night sweats, weight loss, and weight gain.  Eyes       Denies change in vision, eye pain, eye discharge, glasses, contact lenses, and eye surgery. Ear/Nose/Throat/Mouth       Complains of frequent runny nose and sinus problems.      Denies hearing loss/aids, change in hearing, ear pain, ear discharge, dizziness, frequent nose bleeds, sore throat, hoarseness, and tooth pain or bleeding.      Comments: ear pressure Respiratory       Complains of productive cough.      Denies dry cough, wheezing, shortness of breath, asthma, bronchitis, and emphysema/COPD.  Cardiovascular       Denies murmurs, chest pain, and tires easily with exhertion.    Gastrointestinal       Denies stomach pain, nausea/vomiting, diarrhea, constipation, blood in bowel movements, and indigestion. Genitourniary       Denies painful urination, kidney stones, and loss of urinary control. Neurological       Complains of headaches.      Denies paralysis, seizures, and fainting/blackouts. Musculoskeletal       Denies muscle pain, joint pain, joint stiffness, decreased range of motion, redness, swelling, muscle weakness, and gout.   Skin       Denies bruising, unusual mles/lumps or sores, and hair/skin or nail changes.  Psych       Denies mood changes, temper/anger issues, anxiety/stress, speech problems, depression, and sleep problems.  Past History:  Past Medical History: Reviewed history from 11/18/2007 and no changes required. Anxiety Flex Sig normal 12-08  Past Surgical History: Reviewed history from 11/18/2007 and no changes required. tonsillectomy age 57  Family History: Reviewed history from 11/18/2007 and no changes required. Mother died, complications of gastric bypass sister healthy father alive, DM, high chol, HTN MGF AMI > 68  Social History: Reviewed history from 11/18/2007 and no changes required. Married  Never Smoked Alcohol use-yes,socially Drug use-no Regular exercise-no has a toddler son weight increase 30 lbs in 2 yrs. Physical Exam General appearance: well developed, well nourished, no acute distress Head: normocephalic, atraumatic Ears: normal, no lesions or deformities Nasal: CONGESTED Oral/Pharynx: RED WTH PND Neck: neck supple,  trachea midline, no masses Chest/Lungs: no rales, wheezes, or rhonchi bilateral, breath sounds equal without effort Heart: regular rate and  rhythm, no murmur Assessment New Problems: ACUTE SINUSITIS, UNSPECIFIED (ICD-461.9)   Plan New Medications/Changes: AMOXICILLIN 875 MG TABS (AMOXICILLIN) 1 by mouth Q 8 HRS  #  30 x 0, 12/23/2008, Marvis Moeller DO  New Orders: Est. Patient Level III [phone]   Prescriptions: AMOXICILLIN 875 MG TABS (AMOXICILLIN) 1 by mouth Q 8 HRS  #30 x 0   Entered and Authorized by:   Marvis Moeller DO   Signed by:   Marvis Moeller DO on 12/23/2008   Method used:   Electronically to        [facility] [phone]* (retail)       [address]       [place], [place]  [address]       Ph: [phone]       Fax: [phone]   RxID:   [id number]   Patient Instructions: 1)  TYLENOL OR MOTRIN AS NEEDED.  AVOID CAFFEINE AND MILK PRODUCTS. RECOMMEND MUCINEX D AND THROAT APRAY OF CHOICE. MONITOR YOUR BP. FOLLOW UP WITH YOUR PCP OR RETURN IF SYMPTOMS PERSIST.

## 2010-02-03 ENCOUNTER — Encounter: Payer: Self-pay | Admitting: Emergency Medicine

## 2010-02-03 ENCOUNTER — Ambulatory Visit (INDEPENDENT_AMBULATORY_CARE_PROVIDER_SITE_OTHER): Payer: BC Managed Care – PPO | Admitting: Emergency Medicine

## 2010-02-03 DIAGNOSIS — J069 Acute upper respiratory infection, unspecified: Secondary | ICD-10-CM | POA: Insufficient documentation

## 2010-02-03 LAB — CONVERTED CEMR LAB: Rapid Strep: NEGATIVE

## 2010-02-08 NOTE — Assessment & Plan Note (Signed)
Summary: SINUS PROBLEMS,HOARSENESS,SORE THROAT/TJ   Vital Signs:  Patient profile:   30 year old male Height:      72 inches Weight:      258.75 pounds BMI:     35.22 O2 Sat:      97 % on Room air Temp:     99.2 degrees F oral Pulse rate:   70 / minute Resp:     16 per minute BP sitting:   124 / 77  (left arm) Cuff size:   large  Vitals Entered By: Clemens Catholic LPN (February 03, 2010 1:08 PM)  O2 Flow:  Room air CC: cough, head congestion Comments pt c/o cough, head congestion, sore throat, and sinus pain/pressure.no fever. he has taken OTC sudafed and Mucinex with no relief.   Chief Complaint:  cough and head congestion.  History of Present Illness: 30 Years Old Male complains of onset of cold symptoms for 4 days.  AADVIK has been using Sudafed PE and Mucinex which is helping a little bit. + sore throat No cough No pleuritic pain No wheezing + nasal congestion + post-nasal drainage + sinus pain/pressure No chest congestion No itchy/red eyes No earache No hemoptysis No SOB No chills/sweats No fever No nausea No vomiting No abdominal pain No diarrhea No skin rashes No fatigue No myalgias No headache   Current Medications (verified): 1)  None  Allergies (verified): No Known Drug Allergies  Past History:  Past Medical History: Reviewed history from 11/18/2007 and no changes required. Anxiety Flex Sig normal 12-08  Past Surgical History: Reviewed history from 11/18/2007 and no changes required. tonsillectomy age 66  Family History: Reviewed history from 11/18/2007 and no changes required. Mother died, complications of gastric bypass sister healthy father alive, DM, high chol, HTN MGF AMI > 48  Social History: Reviewed history from 02/04/2009 and no changes required. Married  Never Smoked Alcohol use-yes,socially Drug use-no Regular exercise-no has a toddler son weight increase 30 lbs in 2 yrs. 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  Otoscopic examination reveals clear canals, tympanic membranes are intact bilaterally without bulging, retraction, inflammation or discharge. Hearing is grossly normal bilaterally. Nose:  clear discharge Mouth:  clear PND, no erythema, no exudates Lungs:  Normal respiratory effort, chest expands symmetrically. Lungs are clear to auscultation, no crackles or wheezes. Heart:  Normal rate and regular rhythm. S1 and S2 normal without gallop, murmur, click, rub or other extra sounds. Psych:  Cognition and judgment appear intact. Alert and cooperative with normal attention span and concentration. No apparent delusions, illusions, hallucinations   Impression & Recommendations:  Problem # 1:  URI (ICD-465.9) 1)  Take the prescribed antibiotic as instructed.  Hold for a few days since currently is likely just viral 2)  Use nasal saline solution (over the counter) at least 3 times a day. 3)  Use over the counter decongestants like Zyrtec-D every 12 hours as needed to help with congestion. 4)  Can take tylenol every 6 hours or motrin every 8 hours for pain or fever. 5)  Follow up with your primary doctor  if no improvement in 5-7 days, sooner if increasing pain, fever, or new symptoms.    Orders: Rapid Strep (09811) = negative  Complete Medication List: 1)  Amoxicillin 875 Mg Tabs (Amoxicillin) .Marland Kitchen.. 1 by mouth two times a day for 7 days Prescriptions:  AMOXICILLIN 875 MG TABS (AMOXICILLIN) 1 by mouth two times a day for 7 days  #14 x 0   Entered and Authorized by:   Hoyt Koch MD   Signed by:   Hoyt Koch MD on 02/03/2010   Method used:   Print then Give to Patient   RxID:   [id number]    Orders Added: 1)  Est. Patient Level III [14782] 2)  Rapid Strep [95621]    Laboratory  Results  Date/Time Received: February 03, 2010 1:52 PM  Date/Time Reported: February 03, 2010 1:52 PM   Other Tests  Rapid Strep: negative  Kit Test Internal QC: Negative   (Normal Range: Negative)

## 2010-03-27 ENCOUNTER — Encounter: Payer: Self-pay | Admitting: Family Medicine

## 2010-03-31 ENCOUNTER — Ambulatory Visit (INDEPENDENT_AMBULATORY_CARE_PROVIDER_SITE_OTHER): Payer: BC Managed Care – PPO | Admitting: Family Medicine

## 2010-03-31 ENCOUNTER — Encounter: Payer: Self-pay | Admitting: Family Medicine

## 2010-03-31 DIAGNOSIS — R030 Elevated blood-pressure reading, without diagnosis of hypertension: Secondary | ICD-10-CM

## 2010-03-31 DIAGNOSIS — Z130 Encounter for screening for diseases of the blood and blood-forming organs and certain disorders involving the immune mechanism: Secondary | ICD-10-CM

## 2010-03-31 DIAGNOSIS — Z13220 Encounter for screening for lipoid disorders: Secondary | ICD-10-CM

## 2010-03-31 DIAGNOSIS — IMO0001 Reserved for inherently not codable concepts without codable children: Secondary | ICD-10-CM

## 2010-03-31 DIAGNOSIS — F411 Generalized anxiety disorder: Secondary | ICD-10-CM

## 2010-03-31 MED ORDER — FLUOXETINE HCL 10 MG PO CAPS: ORAL_CAPSULE | ORAL | Status: DC

## 2010-03-31 MED ORDER — ALPRAZOLAM 0.5 MG PO TABS: 0.5000 mg | ORAL_TABLET | Freq: Two times a day (BID) | ORAL | Status: DC | PRN

## 2010-03-31 NOTE — Assessment & Plan Note (Signed)
Pt agrees to get back on Prozac with prn use of xanax since this worked well for him in the past.  Call if any problems.  Has a good support system.  F/u in 8 wks.

## 2010-03-31 NOTE — Progress Notes (Signed)
  Subjective:    Patient ID: Louis Phillips, male    DOB: Jul 13, 1980, 30 y.o.   MRN: [id number]  HPI 30 yo WM presents for elevated DBP into the 90s for the past 3 mos.  He gets flushed and feels like his ears are on fire.  This seems to coincide with stress and anxiety.  His father has HTN and heart dz.  He has lost 15 lbs and is eating healthier. Went to the ED last wk for chest pain and palpitations and CXR and EKG were 'normal'.  He was told this was from anxiety.  He used to take Prozac and Xanax.  He is getting more heartburn - on Nexium.  His job is stressful.  He has a good support system.  He is sleeping 6-7 hrs/ night.  Denies any swelling.  BP 137/81  Pulse 64  Ht 6' (1.829 m)  Wt 242 lb (109.77 kg)  BMI 32.82 kg/m2  SpO2 96%   Review of Systems  Respiratory: Negative for chest tightness and shortness of breath.   Cardiovascular: Negative for chest pain, palpitations and leg swelling.  Neurological: Negative for headaches.  Psychiatric/Behavioral: Positive for dysphoric mood. The patient is nervous/anxious.        Objective:   Physical Exam  Constitutional: He appears well-developed and well-nourished.       overwt  Neck: Normal range of motion. No thyromegaly present.  Cardiovascular: Normal rate, regular rhythm, normal heart sounds and intact distal pulses.   Pulmonary/Chest: Effort normal and breath sounds normal. No respiratory distress.  Musculoskeletal: He exhibits no edema.  Skin: Skin is warm and dry.  Psychiatric: He has a normal mood and affect.          Assessment & Plan:

## 2010-03-31 NOTE — Patient Instructions (Signed)
Start Fluoxetine 10 mg once daily x 1 wk then increase to 20 mg once daily. Use Alprazolam as needed for anxiety.  Update fasting labs.  Return for f/u anxiety/ BP in 8 wks.

## 2010-03-31 NOTE — Assessment & Plan Note (Signed)
BP in pre-HTN range today.  He is working on weight loss and is complicated by anxiety d/o.  Will treat his anxiety, work on diet, exercise and wt loss and follow BP closely.

## 2010-04-15 LAB — COMPREHENSIVE METABOLIC PANEL
ALT: 15 U/L (ref 0–53)
AST: 24 U/L (ref 0–37)
Albumin: 4.9 g/dL (ref 3.5–5.2)
Alkaline Phosphatase: 56 U/L (ref 39–117)
BUN: 17 mg/dL (ref 6–23)
CO2: 24 mEq/L (ref 19–32)
Calcium: 9.7 mg/dL (ref 8.4–10.5)
Chloride: 102 mEq/L (ref 96–112)
Creat: 1.05 mg/dL (ref 0.40–1.50)
Glucose, Bld: 83 mg/dL (ref 70–99)
Potassium: 4.4 mEq/L (ref 3.5–5.3)
Sodium: 140 mEq/L (ref 135–145)
Total Bilirubin: 0.9 mg/dL (ref 0.3–1.2)
Total Protein: 7 g/dL (ref 6.0–8.3)

## 2010-04-15 LAB — LIPID PANEL
Cholesterol: 214 mg/dL — ABNORMAL HIGH (ref 0–200)
HDL: 37 mg/dL — ABNORMAL LOW (ref >39)
LDL Cholesterol: 155 mg/dL — ABNORMAL HIGH (ref 0–99)
Total CHOL/HDL Ratio: 5.8 Ratio
Triglycerides: 108 mg/dL (ref <150)
VLDL: 22 mg/dL (ref 0–40)

## 2010-04-15 LAB — TSH: TSH: 1.373 u[IU]/mL (ref 0.350–4.500)

## 2010-04-17 ENCOUNTER — Telehealth: Payer: Self-pay | Admitting: Family Medicine

## 2010-04-17 NOTE — Telephone Encounter (Signed)
LMOM informing Pt of the above 

## 2010-04-17 NOTE — Telephone Encounter (Signed)
Pls let pt know that his fasting sugar, liver and kidney function came back normal but his cholesterol was HIGH.  I'd like him to work on a healthy diet (limit red meat to 1 serving/ wk, avoid excess alcohol, avoid fried foods and fast foods) and increase exercise for the next 3 mos, then plan to recheck cholesterol.  May need medication if not improving.

## 2010-05-01 ENCOUNTER — Other Ambulatory Visit: Payer: Self-pay | Admitting: *Deleted

## 2010-05-01 MED ORDER — ALPRAZOLAM 0.5 MG PO TABS: 0.5000 mg | ORAL_TABLET | Freq: Two times a day (BID) | ORAL | Status: DC | PRN

## 2010-05-22 ENCOUNTER — Other Ambulatory Visit: Payer: Self-pay | Admitting: Family Medicine

## 2010-05-22 NOTE — Telephone Encounter (Signed)
Wants refill of fluoxetine 10 mg caps.  Last refill given 03-31-10 #60/2refills.  Good til 07-01-10.  Pharmacy notified [facility]. Jarvis Newcomer, LPN Domingo Dimes

## 2010-05-26 ENCOUNTER — Other Ambulatory Visit: Payer: Self-pay | Admitting: Family Medicine

## 2010-05-26 ENCOUNTER — Ambulatory Visit (INDEPENDENT_AMBULATORY_CARE_PROVIDER_SITE_OTHER): Payer: BC Managed Care – PPO | Admitting: Family Medicine

## 2010-05-26 ENCOUNTER — Encounter: Payer: Self-pay | Admitting: Family Medicine

## 2010-05-26 DIAGNOSIS — F329 Major depressive disorder, single episode, unspecified: Secondary | ICD-10-CM | POA: Insufficient documentation

## 2010-05-26 DIAGNOSIS — I10 Essential (primary) hypertension: Secondary | ICD-10-CM

## 2010-05-26 MED ORDER — ENALAPRIL MALEATE 5 MG PO TABS: 5.0000 mg | ORAL_TABLET | Freq: Every day | ORAL | Status: DC

## 2010-05-26 MED ORDER — ALPRAZOLAM 0.5 MG PO TABS: 0.5000 mg | ORAL_TABLET | Freq: Two times a day (BID) | ORAL | Status: DC | PRN

## 2010-05-26 MED ORDER — FLUOXETINE HCL 20 MG PO CAPS: 20.0000 mg | ORAL_CAPSULE | Freq: Every day | ORAL | Status: DC

## 2010-05-26 NOTE — Assessment & Plan Note (Signed)
This is his 2nd reading >140/90 with strong fam hx of HTN.  Will start Enalapril 5 mg/ day.  Labs updated last month.  Call if any problems.  RTC in 2 mos for f/u with a BMP. Work on low sodium diet, reg exercise.

## 2010-05-26 NOTE — Progress Notes (Signed)
  Subjective:    Patient ID: Louis Phillips, male    DOB: [birth year], 30 y.o.   MRN: [id number]  HPI  30 yo WM presents for f/u mood.  He was started on fluoxetine 2 mos ago and has seen a big improvement in his mood.  Denies adverse side effects.  Has a strong fam hx of HTN and has been running high at home with episodes of facial flushing.  He is eating healthy and exercising.  Not on any cold meds.  Denies problems voiding or blurry vision.  His reflux is well controlled on Nexium 1 x a day.    BP 150/73  Pulse 64  Ht 6' (1.829 m)  Wt 239 lb (108.41 kg)  BMI 32.41 kg/m2  SpO2 98%    Review of Systems  Constitutional: Negative for fatigue and unexpected weight change.  Respiratory: Negative for shortness of breath.   Cardiovascular: Negative for chest pain, palpitations and leg swelling.  Genitourinary: Negative for difficulty urinating.  Neurological: Negative for headaches.  Psychiatric/Behavioral: Negative for suicidal ideas, sleep disturbance and dysphoric mood. The patient is not nervous/anxious.        Objective:   Physical Exam  Constitutional: He appears well-nourished.  Neck: No thyromegaly present.  Cardiovascular: Normal rate, regular rhythm and normal heart sounds.   Pulmonary/Chest: Effort normal and breath sounds normal.  Musculoskeletal: He exhibits no edema.  Psychiatric: He has a normal mood and affect.          Assessment & Plan:

## 2010-05-26 NOTE — Patient Instructions (Signed)
Start Enalapril once daily in the mornings for high BP.  Stay on fluoxetine 20 mg once daily for mood, saving Alprazolam as needed for anxiety.  OK to Stay on Nexium daily for reflux.  Return for f/u BP/ BMP (lab) in 8 wks.

## 2010-05-26 NOTE — Assessment & Plan Note (Signed)
Mood much improved with Fluoxetine, 20 mg/ day with a PHQ 9 score of 3 and a GAD 7 score of 4.  Will RF.

## 2010-05-26 NOTE — Telephone Encounter (Signed)
Request for refill 90 day supply of fluoxetine 10 mg capsules.

## 2010-05-31 NOTE — Telephone Encounter (Signed)
Pt cannot have a 90 supply at this time because he needs an office visit follow up.  Pharmacy  [facility] was notified by fax on 05-22-10. Jarvis Newcomer, LPN [facility]

## 2010-07-19 ENCOUNTER — Other Ambulatory Visit: Payer: Self-pay | Admitting: Family Medicine

## 2010-07-19 NOTE — Telephone Encounter (Signed)
Request received for fluoxetine 20 mg.  Rx was sent on 05-26-10 for #30/5 refills. Denied. Jarvis Newcomer, LPN [facility]

## 2010-07-20 NOTE — Telephone Encounter (Signed)
Closed

## 2010-08-01 ENCOUNTER — Ambulatory Visit (INDEPENDENT_AMBULATORY_CARE_PROVIDER_SITE_OTHER): Payer: BC Managed Care – PPO | Admitting: Family Medicine

## 2010-08-01 ENCOUNTER — Encounter: Payer: Self-pay | Admitting: Family Medicine

## 2010-08-01 VITALS — BP 123/77 | HR 84 | Ht 72.0 in | Wt 246.0 lb

## 2010-08-01 DIAGNOSIS — F411 Generalized anxiety disorder: Secondary | ICD-10-CM

## 2010-08-01 DIAGNOSIS — I10 Essential (primary) hypertension: Secondary | ICD-10-CM

## 2010-08-01 NOTE — Patient Instructions (Signed)
Stay on current meds.  BP looks great.  Lab  Today.  Will call you w/ results tomorrow.  Work on [employer] and regular exercise.  Return for f/u mood/ BP in 3 mos.

## 2010-08-01 NOTE — Progress Notes (Signed)
  Subjective:    Patient ID: Louis Phillips, male    DOB: August 30, 1980, 30 y.o.   MRN: [id number]  HPI  30 yo WM presents for f/u HTN.  Diagnosed 2 mos ago. Started on Enalapril and is due for BMP today.  No vision changes.  No CP or DOE. No palpitations or leg swelling.  Mood stable on meds.  BP 123/77  Pulse 84  Ht 6' (1.829 m)  Wt 246 lb (111.585 kg)  BMI 33.36 kg/m2  SpO2 96%     Review of Systems  Constitutional: Negative for fatigue.  Eyes: Negative for visual disturbance.  Respiratory: Negative for shortness of breath.   Cardiovascular: Negative for chest pain, palpitations and leg swelling.  Genitourinary: Negative for difficulty urinating.  Neurological: Negative for headaches.       Objective:   Physical Exam  Constitutional: He appears well-developed and well-nourished.  HENT:  Mouth/Throat: Oropharynx is clear and moist.  Cardiovascular: Normal rate, regular rhythm and normal heart sounds.   No murmur heard. Pulmonary/Chest: Effort normal and breath sounds normal.  Musculoskeletal: He exhibits no edema.  Psychiatric: He has a normal mood and affect.          Assessment & Plan:   No problem-specific assessment & plan notes found for this encounter.

## 2010-08-01 NOTE — Assessment & Plan Note (Signed)
Improved, stable on current meds.  F/u in 3 mos.

## 2010-08-01 NOTE — Assessment & Plan Note (Signed)
Much improved on current meds.  Continue.  Update BMP today.

## 2010-08-02 ENCOUNTER — Telehealth: Payer: Self-pay | Admitting: Family Medicine

## 2010-08-02 LAB — BASIC METABOLIC PANEL WITH GFR
BUN: 23 mg/dL (ref 6–23)
CO2: 23 mEq/L (ref 19–32)
Calcium: 10.1 mg/dL (ref 8.4–10.5)
Chloride: 103 mEq/L (ref 96–112)
Creat: 1.21 mg/dL (ref 0.50–1.35)
GFR, Est African American: >60 mL/min (ref >60)
GFR, Est Non African American: >60 mL/min (ref >60)
Glucose, Bld: 90 mg/dL (ref 70–99)
Potassium: 4.2 mEq/L (ref 3.5–5.3)
Sodium: 138 mEq/L (ref 135–145)

## 2010-08-02 NOTE — Telephone Encounter (Signed)
Pls let pt know that his chemistry panel looks perfect.  Stay on current meds.

## 2010-08-02 NOTE — Telephone Encounter (Signed)
Advised pt of results.

## 2010-09-21 ENCOUNTER — Other Ambulatory Visit: Payer: Self-pay | Admitting: Family Medicine

## 2010-09-21 MED ORDER — ENALAPRIL MALEATE 5 MG PO TABS: 5.0000 mg | ORAL_TABLET | Freq: Every day | ORAL | Status: DC

## 2010-09-21 NOTE — Telephone Encounter (Signed)
[facility] called and requesting refill for the pt for his enalapril 5 mg. Plan:  Refilled medication # 30/5 refills.  Pt UTD on appt. Jarvis Newcomer, LPN [facility]

## 2010-09-26 ENCOUNTER — Encounter: Payer: Self-pay | Admitting: Family Medicine

## 2010-10-08 ENCOUNTER — Encounter: Payer: Self-pay | Admitting: Family Medicine

## 2010-10-08 ENCOUNTER — Inpatient Hospital Stay (INDEPENDENT_AMBULATORY_CARE_PROVIDER_SITE_OTHER)
Admission: RE | Admit: 2010-10-08 | Discharge: 2010-10-08 | Disposition: A | Discharge status: Home / Self Care | Payer: BC Managed Care – PPO | Source: Ambulatory Visit | Attending: Family Medicine | Admitting: Family Medicine

## 2010-10-08 DIAGNOSIS — E785 Hyperlipidemia, unspecified: Secondary | ICD-10-CM | POA: Insufficient documentation

## 2010-10-08 DIAGNOSIS — J069 Acute upper respiratory infection, unspecified: Secondary | ICD-10-CM

## 2010-10-08 DIAGNOSIS — J0100 Acute maxillary sinusitis, unspecified: Secondary | ICD-10-CM

## 2010-10-10 ENCOUNTER — Ambulatory Visit (INDEPENDENT_AMBULATORY_CARE_PROVIDER_SITE_OTHER): Payer: BC Managed Care – PPO | Admitting: Family Medicine

## 2010-10-10 ENCOUNTER — Encounter: Payer: Self-pay | Admitting: Family Medicine

## 2010-10-10 VITALS — BP 130/75 | HR 73 | Wt 244.0 lb

## 2010-10-10 DIAGNOSIS — E785 Hyperlipidemia, unspecified: Secondary | ICD-10-CM

## 2010-10-10 DIAGNOSIS — I10 Essential (primary) hypertension: Secondary | ICD-10-CM

## 2010-10-10 NOTE — Progress Notes (Signed)
  Subjective:    Patient ID: Louis Phillips, male    DOB: 07-28-1980, 30 y.o.   MRN: [id number]  Hypertension This is a chronic problem. The current episode started more than 1 month ago. The problem is unchanged. The problem is controlled. Pertinent negatives include no chest pain or shortness of breath. There are no associated agents to hypertension. Risk factors for coronary artery disease include male gender. Past treatments include ACE inhibitors. The current treatment provides moderate improvement. There are no compliance problems.      Pertaining his anxiety medications. He says he feels well and doesn't feel he needs them.  Review of Systems  Respiratory: Negative for shortness of breath.   Cardiovascular: Negative for chest pain.       Objective:   Physical Exam  Constitutional: He is oriented to person, place, and time. He appears well-developed and well-nourished.  HENT:  Head: Normocephalic and atraumatic.  Cardiovascular: Normal rate, regular rhythm and normal heart sounds.   Pulmonary/Chest: Effort normal and breath sounds normal.  Musculoskeletal: He exhibits no edema.  Neurological: He is alert and oriented to person, place, and time.  Skin: Skin is warm and dry.  Psychiatric: He has a normal mood and affect. His behavior is normal.          Assessment & Plan:  HTN- well controlled. F/U in 6 months. toleraing meds well.  Hyperlipidemia - Due to recheck his lipids on diet and exercise. Given lab slip today.   Anxiety - resolved. Off his meds and doing well.

## 2010-10-10 NOTE — Patient Instructions (Signed)
We will call you with your blood work results.

## 2010-10-27 ENCOUNTER — Other Ambulatory Visit: Payer: Self-pay | Admitting: Family Medicine

## 2010-10-28 LAB — BASIC METABOLIC PANEL WITH GFR
BUN: 17 mg/dL (ref 6–23)
CO2: 29 mEq/L (ref 19–32)
Calcium: 10 mg/dL (ref 8.4–10.5)
Chloride: 100 mEq/L (ref 96–112)
Creat: 0.89 mg/dL (ref 0.50–1.35)
GFR, Est African American: >90 mL/min (ref >90)
GFR, Est Non African American: >90 mL/min (ref >90)
Glucose, Bld: 83 mg/dL (ref 70–99)
Potassium: 4.4 mEq/L (ref 3.5–5.3)
Sodium: 138 mEq/L (ref 135–145)

## 2010-10-28 LAB — LIPID PANEL
Cholesterol: 194 mg/dL (ref 0–200)
HDL: 28 mg/dL — ABNORMAL LOW (ref >39)
LDL Cholesterol: 100 mg/dL — ABNORMAL HIGH (ref 0–99)
Total CHOL/HDL Ratio: 6.9 Ratio
Triglycerides: 329 mg/dL — ABNORMAL HIGH (ref <150)
VLDL: 66 mg/dL — ABNORMAL HIGH (ref 0–40)

## 2010-10-30 ENCOUNTER — Encounter: Payer: Self-pay | Admitting: Family Medicine

## 2010-12-04 NOTE — Progress Notes (Signed)
Summary: cold/achey/TM(rm4)   Vital Signs:  Patient Profile:   30 Years Old Male CC:      Cold & URI symptoms Height:     72 inches Weight:      244.4 pounds O2 treatment:    coldRoom Air Temp:     100.7 degrees F oral Pulse rate:   76 / minute Resp:     24 per minute BP sitting:   137 / 91  (left arm) Cuff size:   regular  Vitals Entered By: Linton Flemings RN (October 08, 2010 2:40 PM)                  Updated Prior Medication List: cholesterol med Current Allergies (reviewed today): No known allergies History of Present Illness Chief Complaint: Cold & URI symptoms History of Present Illness: 30 yo M here for 2 days of achiness, mucus drainage, low grade fever, increasing sinus pain.  Slight cough and sore throat.  + ear pressure.  Has tried [employer].  REVIEW OF SYSTEMS Constitutional Symptoms       Complains of fever.     Denies chills, night sweats, weight loss, weight gain, and fatigue.  Eyes       Denies change in vision, eye pain, eye discharge, glasses, contact lenses, and eye surgery. Ear/Nose/Throat/Mouth       Complains of sinus problems.      Denies hearing loss/aids, change in hearing, ear pain, ear discharge, dizziness, frequent runny nose, frequent nose bleeds, sore throat, hoarseness, and tooth pain or bleeding.  Respiratory       Complains of productive cough.      Denies wheezing, shortness of breath, asthma, bronchitis, and emphysema/COPD.      Comments: grrenish sputum Cardiovascular       Complains of chest pain.      Denies murmurs and tires easily with exhertion.      Comments: wtith deep breaths   Gastrointestinal       Denies stomach pain, nausea/vomiting, diarrhea, constipation, blood in bowel movements, and indigestion. Genitourniary       Denies painful urination, kidney stones, and loss of urinary control. Neurological       Denies paralysis, seizures, and fainting/blackouts. Musculoskeletal       Denies muscle pain, joint pain, joint  stiffness, decreased range of motion, redness, swelling, muscle weakness, and gout.  Skin       Denies bruising, unusual mles/lumps or sores, and hair/skin or nail changes.  Psych       Denies mood changes, temper/anger issues, anxiety/stress, speech problems, depression, and sleep problems. Other Comments: symptoms started Fiday   Past History:  Family History: Last updated: Nov 30, 2007 Mother died, complications of gastric bypass sister healthy father alive, DM, high chol, HTN MGF AMI > 42  Social History: Last updated: 02/04/2009 Married  Never Smoked Alcohol use-yes,socially Drug use-no Regular exercise-no has a toddler son weight increase 30 lbs in 2 yrs. 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 Mother died, complications of gastric bypass sister healthy father alive, DM, high chol, HTN MGF AMI > 24  Social History: Reviewed history from 02/04/2009 and no changes required. Married  Never Smoked Alcohol use-yes,socially Drug use-no Regular exercise-no has a toddler son weight increase 30 lbs in 2 yrs. 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 Physical Exam General appearance: well developed, well nourished,  no acute distress Head: normocephalic, atraumatic TTP L > R maxillary sinuses Eyes: conjunctivae and lids normal Ears: normal, no lesions or deformities Nasal: clear discharge Oral/Pharynx: tongue normal, posterior pharynx without erythema or exudate Neck: neck supple,  trachea midline, no masses Chest/Lungs: no rales, wheezes, or rhonchi bilateral, breath sounds equal without effort Heart: regular rate and  rhythm, no murmur Assessment New Problems: HYPERLIPIDEMIA (ICD-272.4) URI (ICD-465.9) ACUTE MAXILLARY SINUSITIS (ICD-461.0)   Plan New Orders: Est. Patient Level III  [82956] Planning Comments:   flu-like illness vs URI/sinusitis - may benefit from abx with temp > 100.4 and sinus pain - azithromycin 250mg  - 2 tabs day 1, 1 tab day 2-5.  OTC meds discussed and handout provided.   The patient and/or caregiver has been counseled thoroughly with regard to medications prescribed including dosage, schedule, interactions, rationale for use, and possible side effects and they verbalize understanding.  Diagnoses and expected course of recovery discussed and will return if not improved as expected or if the condition worsens. Patient and/or caregiver verbalized understanding.   Orders Added: 1)  Est. Patient Level III [21308]

## 2011-04-06 ENCOUNTER — Encounter: Payer: Self-pay | Admitting: *Deleted

## 2011-04-10 ENCOUNTER — Ambulatory Visit: Payer: BC Managed Care – PPO | Admitting: Family Medicine

## 2011-04-20 ENCOUNTER — Encounter: Payer: Self-pay | Admitting: Family Medicine

## 2011-04-20 ENCOUNTER — Ambulatory Visit (INDEPENDENT_AMBULATORY_CARE_PROVIDER_SITE_OTHER): Payer: BC Managed Care – PPO | Admitting: Family Medicine

## 2011-04-20 VITALS — BP 130/76 | HR 59 | Ht 72.0 in | Wt 244.0 lb

## 2011-04-20 DIAGNOSIS — E785 Hyperlipidemia, unspecified: Secondary | ICD-10-CM

## 2011-04-20 DIAGNOSIS — E781 Pure hyperglyceridemia: Secondary | ICD-10-CM

## 2011-04-20 DIAGNOSIS — I10 Essential (primary) hypertension: Secondary | ICD-10-CM

## 2011-04-20 MED ORDER — ENALAPRIL MALEATE 5 MG PO TABS: 5.0000 mg | ORAL_TABLET | Freq: Every day | ORAL | Status: DC

## 2011-04-20 NOTE — Assessment & Plan Note (Signed)
Due to recheck TG. Discussed low-fat diet, regular exercise, fish oil and flaxseed. H.O given.

## 2011-04-20 NOTE — Progress Notes (Signed)
  Subjective:    Patient ID: Louis Phillips, male    DOB: 20-Feb-1980, 31 y.o.   MRN: [id number]  HPI  HTN -no chest pain or shortness of breath. He is taking his medication regularly without any side effects. He is for refills. He was last seen 6 months ago.  Review of Systems     Objective:   Physical Exam  Constitutional: He is oriented to person, place, and time. He appears well-developed and well-nourished.  HENT:  Head: Normocephalic and atraumatic.  Cardiovascular: Normal rate, regular rhythm and normal heart sounds.   Pulmonary/Chest: Effort normal and breath sounds normal.  Neurological: He is alert and oriented to person, place, and time.  Skin: Skin is warm and dry.  Psychiatric: He has a normal mood and affect. His behavior is normal.          Assessment & Plan:

## 2011-04-20 NOTE — Patient Instructions (Signed)
Hypertriglyceridemia  Diet for High blood levels of Triglycerides Most fats in food are triglycerides. Triglycerides in your blood are stored as fat in your body. High levels of triglycerides in your blood may put you at a greater risk for heart disease and stroke.  Normal triglyceride levels are less than 150 mg/dL. Borderline high levels are 150-199 mg/dl. High levels are 200 - 499 mg/dL, and very high triglyceride levels are greater than 500 mg/dL. The decision to treat high triglycerides is generally based on the level. For people with borderline or high triglyceride levels, treatment includes weight loss and exercise. Drugs are recommended for people with very high triglyceride levels. Many people who need treatment for high triglyceride levels have metabolic syndrome. This syndrome is a collection of disorders that often include: insulin resistance, high blood pressure, blood clotting problems, high cholesterol and triglycerides. TESTING PROCEDURE FOR TRIGLYCERIDES  You should not eat 4 hours before getting your triglycerides measured. The normal range of triglycerides is between 10 and 250 milligrams per deciliter (mg/dl). Some people may have extreme levels (1000 or above), but your triglyceride level may be too high if it is above 150 mg/dl, depending on what other risk factors you have for heart disease.   People with high blood triglycerides may also have high blood cholesterol levels. If you have high blood cholesterol as well as high blood triglycerides, your risk for heart disease is probably greater than if you only had high triglycerides. High blood cholesterol is one of the main risk factors for heart disease.  CHANGING YOUR DIET  Your weight can affect your blood triglyceride level. If you are more than 20% above your ideal body weight, you may be able to lower your blood triglycerides by losing weight. Eating less and exercising regularly is the best way to combat this. Fat provides  more calories than any other food. The best way to lose weight is to eat less fat. Only 30% of your total calories should come from fat. Less than 7% of your diet should come from saturated fat. A diet low in fat and saturated fat is the same as a diet to decrease blood cholesterol. By eating a diet lower in fat, you may lose weight, lower your blood cholesterol, and lower your blood triglyceride level.  Eating a diet low in fat, especially saturated fat, may also help you lower your blood triglyceride level. Ask your dietitian to help you figure how much fat you can eat based on the number of calories your caregiver has prescribed for you.  Exercise, in addition to helping with weight loss may also help lower triglyceride levels.   Alcohol can increase blood triglycerides. You may need to stop drinking alcoholic beverages.   Too much carbohydrate in your diet may also increase your blood triglycerides. Some complex carbohydrates are necessary in your diet. These may include bread, rice, potatoes, other starchy vegetables and cereals.   Reduce "simple" carbohydrates. These may include pure sugars, candy, honey, and jelly without losing other nutrients. If you have the kind of high blood triglycerides that is affected by the amount of carbohydrates in your diet, you will need to eat less sugar and less high-sugar foods. Your caregiver can help you with this.   Adding 2-4 grams of fish oil (EPA+ DHA) may also help lower triglycerides. Speak with your caregiver before adding any supplements to your regimen.  Following the Diet  Maintain your ideal weight. Your caregivers can help you with a diet. Generally,   eating less food and getting more exercise will help you lose weight. Joining a weight control group may also help. Ask your caregivers for a good weight control group in your area.  Eat low-fat foods instead of high-fat foods. This can help you lose weight too.  These foods are lower in fat. Eat MORE  of these:   Dried beans, peas, and lentils.   Egg whites.   Low-fat cottage cheese.   Fish.   Lean cuts of meat, such as round, sirloin, rump, and flank (cut extra fat off meat you fix).   Whole grain breads, cereals and pasta.   Skim and nonfat dry milk.   Low-fat yogurt.   Poultry without the skin.   Cheese made with skim or part-skim milk, such as mozzarella, parmesan, farmers', ricotta, or pot cheese.  These are higher fat foods. Eat LESS of these:   Whole milk and foods made from whole milk, such as American, blue, cheddar, monterey jack, and swiss cheese   High-fat meats, such as luncheon meats, sausages, knockwurst, bratwurst, hot dogs, ribs, corned beef, ground pork, and regular ground beef.   Fried foods.  Limit saturated fats in your diet. Substituting unsaturated fat for saturated fat may decrease your blood triglyceride level. You will need to read package labels to know which products contain saturated fats.  These foods are high in saturated fat. Eat LESS of these:   Fried pork skins.   Whole milk.   Skin and fat from poultry.   Palm oil.   Butter.   Shortening.   Cream cheese.   Bacon.   Margarines and baked goods made from listed oils.   Vegetable shortenings.   Chitterlings.   Fat from meats.   Coconut oil.   Palm kernel oil.   Lard.   Cream.   Sour cream.   Fatback.   Coffee whiteners and non-dairy creamers made with these oils.   Cheese made from whole milk.  Use unsaturated fats (both polyunsaturated and monounsaturated) moderately. Remember, even though unsaturated fats are better than saturated fats; you still want a diet low in total fat.  These foods are high in unsaturated fat:   Canola oil.   Sunflower oil.   Mayonnaise.   Almonds.   Peanuts.   Pine nuts.   Margarines made with these oils.   Safflower oil.   Olive oil.   Avocados.   Cashews.   Peanut butter.   Sunflower seeds.   Soybean oil.     Peanut oil.   Olives.   Pecans.   Walnuts.   Pumpkin seeds.  Avoid sugar and other high-sugar foods. This will decrease carbohydrates without decreasing other nutrients. Sugar in your food goes rapidly to your blood. When there is excess sugar in your blood, your liver may use it to make more triglycerides. Sugar also contains calories without other important nutrients.  Eat LESS of these:   Sugar, brown sugar, powdered sugar, jam, jelly, preserves, honey, syrup, molasses, pies, candy, cakes, cookies, frosting, pastries, colas, soft drinks, punches, fruit drinks, and regular gelatin.   Avoid alcohol. Alcohol, even more than sugar, may increase blood triglycerides. In addition, alcohol is high in calories and low in nutrients. Ask for sparkling water, or a diet soft drink instead of an alcoholic beverage.  Suggestions for planning and preparing meals   Bake, broil, grill or roast meats instead of frying.   Remove fat from meats and skin from poultry before cooking.   Add spices,   herbs, lemon juice or vinegar to vegetables instead of salt, rich sauces or gravies.   Use a non-stick skillet without fat or use no-stick sprays.   Cool and refrigerate stews and broth. Then remove the hardened fat floating on the surface before serving.   Refrigerate meat drippings and skim off fat to make low-fat gravies.   Serve more fish.   Use less butter, margarine and other high-fat spreads on bread or vegetables.   Use skim or reconstituted non-fat dry milk for cooking.   Cook with low-fat cheeses.   Substitute low-fat yogurt or cottage cheese for all or part of the sour cream in recipes for sauces, dips or congealed salads.   Use half yogurt/half mayonnaise in salad recipes.   Substitute evaporated skim milk for cream. Evaporated skim milk or reconstituted non-fat dry milk can be whipped and substituted for whipped cream in certain recipes.   Choose fresh fruits for dessert instead of  high-fat foods such as pies or cakes. Fruits are naturally low in fat.  When Dining Out   Order low-fat appetizers such as fruit or vegetable juice, pasta with vegetables or tomato sauce.   Select clear, rather than cream soups.   Ask that dressings and gravies be served on the side. Then use less of them.   Order foods that are baked, broiled, poached, steamed, stir-fried, or roasted.   Ask for margarine instead of butter, and use only a small amount.   Drink sparkling water, unsweetened tea or coffee, or diet soft drinks instead of alcohol or other sweet beverages.  QUESTIONS AND ANSWERS ABOUT OTHER FATS IN THE BLOOD: SATURATED FAT, TRANS FAT, AND CHOLESTEROL What is trans fat? Trans fat is a type of fat that is formed when vegetable oil is hardened through a process called hydrogenation. This process helps makes foods more solid, gives them shape, and prolongs their shelf life. Trans fats are also called hydrogenated or partially hydrogenated oils.  What do saturated fat, trans fat, and cholesterol in foods have to do with heart disease? Saturated fat, trans fat, and cholesterol in the diet all raise the level of LDL "bad" cholesterol in the blood. The higher the LDL cholesterol, the greater the risk for coronary heart disease (CHD). Saturated fat and trans fat raise LDL similarly.  What foods contain saturated fat, trans fat, and cholesterol? High amounts of saturated fat are found in animal products, such as fatty cuts of meat, chicken skin, and full-fat dairy products like butter, whole milk, cream, and cheese, and in tropical vegetable oils such as palm, palm kernel, and coconut oil. Trans fat is found in some of the same foods as saturated fat, such as vegetable shortening, some margarines (especially hard or stick margarine), crackers, cookies, baked goods, fried foods, salad dressings, and other processed foods made with partially hydrogenated vegetable oils. Small amounts of trans fat  also occur naturally in some animal products, such as milk products, beef, and lamb. Foods high in cholesterol include liver, other organ meats, egg yolks, shrimp, and full-fat dairy products. How can I use the new food label to make heart-healthy food choices? Check the Nutrition Facts panel of the food label. Choose foods lower in saturated fat, trans fat, and cholesterol. For saturated fat and cholesterol, you can also use the Percent Daily Value (%DV): 5% DV or less is low, and 20% DV or more is high. (There is no %DV for trans fat.) Use the Nutrition Facts panel to choose foods low in   saturated fat and cholesterol, and if the trans fat is not listed, read the ingredients and limit products that list shortening or hydrogenated or partially hydrogenated vegetable oil, which tend to be high in trans fat. POINTS TO REMEMBER: YOU NEED A LITTLE TLC (THERAPEUTIC LIFESTYLE CHANGES)  Discuss your risk for heart disease with your caregivers, and take steps to reduce risk factors.   Change your diet. Choose foods that are low in saturated fat, trans fat, and cholesterol.   Add exercise to your daily routine if it is not already being done. Participate in physical activity of moderate intensity, like brisk walking, for at least 30 minutes on most, and preferably all days of the week. No time? Break the 30 minutes into three, 10-minute segments during the day.   Stop smoking. If you do smoke, contact your caregiver to discuss ways in which they can help you quit.   Do not use street drugs.   Maintain a normal weight.   Maintain a healthy blood pressure.   Keep up with your blood work for checking the fats in your blood as directed by your caregiver.  Document Released: 10/06/2003 Document Revised: 12/07/2010 Document Reviewed: 05/03/2008 [employer] Patient Information 2012 [employer]. 

## 2011-04-20 NOTE — Assessment & Plan Note (Signed)
Well controlled. Medications refilled. Followup in 6 months. Continue her on low salt diet.

## 2011-05-05 LAB — TRIGLYCERIDES: Triglycerides: 155 mg/dL — ABNORMAL HIGH (ref <150)

## 2011-06-15 ENCOUNTER — Encounter: Payer: Self-pay | Admitting: Emergency Medicine

## 2011-06-15 ENCOUNTER — Emergency Department
Admission: EM | Admit: 2011-06-15 | Discharge: 2011-06-15 | Disposition: A | Discharge status: Home / Self Care | Payer: BC Managed Care – PPO | Source: Home / Self Care | Attending: Emergency Medicine | Admitting: Emergency Medicine

## 2011-06-15 DIAGNOSIS — L259 Unspecified contact dermatitis, unspecified cause: Secondary | ICD-10-CM

## 2011-06-15 HISTORY — DX: Essential (primary) hypertension: I10

## 2011-06-15 MED ORDER — CLOBETASOL PROPIONATE 0.05 % EX CREA: TOPICAL_CREAM | Freq: Two times a day (BID) | CUTANEOUS | Status: DC

## 2011-06-15 NOTE — ED Notes (Signed)
Red itchy rash on lower right abdomen x 6 days

## 2011-06-15 NOTE — ED Provider Notes (Signed)
History     CSN: [id number]  Arrival date & time 06/15/11  1410   First MD Initiated Contact with Patient 06/15/11 1428      Chief Complaint  Patient presents with  . Rash    (Consider location/radiation/quality/duration/timing/severity/associated sxs/prior treatment) HPI This previously healthy male comes in today complaining of a red itchy rash on his right lower abdomen for the last 6 days.  He states that prior to breaking out, he was working outside around a wood pile.  He does not recall seeing any spiders or other insects but wonders if he got bit by a spider.  He states that the lesion is very itchy but is not painful. He has not been using any medications or creams.  He is otherwise feeling healthy.  Past Medical History  Diagnosis Date  . Anxiety   . Hypertension     Past Surgical History  Procedure Date  . Tonsillectomy 31 yrs old    Family History  Problem Relation Age of Onset  . Diabetes Father   . Hypertension Father   . Hyperlipidemia Father   . Other Maternal Grandmother 55    AMI  . Heart disease Maternal Grandmother 55    AMI  . Diabetes Mother   . Hyperlipidemia Mother   . Hypertension Mother     History  Substance Use Topics  . Smoking status: Never Smoker   . Smokeless tobacco: Not on file  . Alcohol Use: Yes     socially      Review of Systems  All other systems reviewed and are negative.    Allergies  Review of patient's allergies indicates not on file.  Home Medications   Current Outpatient Rx  Name Route Sig Dispense Refill  . CLOBETASOL PROPIONATE 0.05 % EX CREA Topical Apply topically 2 (two) times daily. Apply to affected area 30 g 0  . ENALAPRIL MALEATE 5 MG PO TABS Oral Take 1 tablet (5 mg total) by mouth daily. 90 tablet 1    BP 136/83  Pulse 98  Temp 98.1 F (36.7 C) (Oral)  Resp 16  Ht 6' (1.829 m)  Wt 245 lb (111.131 kg)  BMI 33.23 kg/m2  Physical Exam  Nursing note and vitals reviewed. Constitutional:  He is oriented to person, place, and time. He appears well-developed and well-nourished.  HENT:  Head: Normocephalic and atraumatic.  Eyes: No scleral icterus.  Neck: Neck supple.  Cardiovascular: Regular rhythm and normal heart sounds.   Pulmonary/Chest: Effort normal and breath sounds normal. No respiratory distress.  Abdominal:    Neurological: He is alert and oriented to person, place, and time.  Skin: Skin is warm and dry.  Psychiatric: He has a normal mood and affect. His speech is normal.    ED Course  Procedures (including critical care time)  Labs Reviewed - No data to display No results found.   1. Contact dermatitis       MDM   The rash appears to be a contact dermatitis in this would be reasonable since he was working outside.  Does not look like it is a bacterial infection, ringworm, shingles, or any other type of insect bite.  I am going to give him a prescription for clobetasol steroid cream which should help with the itching and irritation.  If it is getting worse, I advised him to either call or come back to clinic so he can have a look at it.  Marlaine Hind, MD 06/15/11 [phone]

## 2011-10-15 ENCOUNTER — Ambulatory Visit: Payer: BC Managed Care – PPO | Admitting: Family Medicine

## 2011-10-19 ENCOUNTER — Ambulatory Visit: Payer: BC Managed Care – PPO | Admitting: Family Medicine

## 2011-10-23 ENCOUNTER — Emergency Department
Admission: EM | Admit: 2011-10-23 | Discharge: 2011-10-23 | Disposition: A | Discharge status: Home / Self Care | Payer: BC Managed Care – PPO | Source: Home / Self Care | Attending: Family Medicine | Admitting: Family Medicine

## 2011-10-23 DIAGNOSIS — J029 Acute pharyngitis, unspecified: Secondary | ICD-10-CM

## 2011-10-23 DIAGNOSIS — R221 Localized swelling, mass and lump, neck: Secondary | ICD-10-CM

## 2011-10-23 DIAGNOSIS — R220 Localized swelling, mass and lump, head: Secondary | ICD-10-CM

## 2011-10-23 LAB — POCT RAPID STREP A (OFFICE): Rapid Strep A Screen: NEGATIVE

## 2011-10-23 NOTE — ED Provider Notes (Signed)
History     CSN: [id number]  Arrival date & time 10/23/11  1836   First MD Initiated Contact with Patient 10/23/11 1903      Chief Complaint  Patient presents with  . Oral Swelling    x 1 day  . Nausea    yesterday      HPI Comments: Luvern complains of onset of mild dizziness, fatigue, and nausea yesterday. Today his tongue and throat feels "different". Denies fever, chills and sweats. He also has some trouble taking a deep breath, and feels tightness in his anterior chest.  He has noticed increased sinus congestion recently.   The history is provided by the patient.    Past Medical History  Diagnosis Date  . Anxiety   . Hypertension     Past Surgical History  Procedure Date  . Tonsillectomy 31 yrs old    Family History  Problem Relation Age of Onset  . Diabetes Father   . Hypertension Father   . Hyperlipidemia Father   . Other Maternal Grandmother 55    AMI  . Heart disease Maternal Grandmother 55    AMI  . Diabetes Mother   . Hyperlipidemia Mother   . Hypertension Mother     History  Substance Use Topics  . Smoking status: Never Smoker   . Smokeless tobacco: Never Used  . Alcohol Use: Yes     socially      Review of Systems + sore throat, mild No cough No pleuritic pain, but feels tight in anterior chest No wheezing + dizzy yesterday + nasal congestion ? post-nasal drainage No sinus pain/pressure No itchy/red eyes No earache No hemoptysis No SOB No fever/chills No nausea No vomiting No abdominal pain No diarrhea No urinary symptoms No skin rashes + fatigue No myalgias No headache   Allergies  Review of patient's allergies indicates no known allergies.  Home Medications   Current Outpatient Rx  Name Route Sig Dispense Refill  . DICYCLOMINE HCL 20 MG PO TABS Oral Take 20 mg by mouth every 6 (six) hours.    . ENALAPRIL MALEATE 5 MG PO TABS Oral Take 1 tablet (5 mg total) by mouth daily. 90 tablet 1  . ESOMEPRAZOLE MAGNESIUM 40  MG PO CPDR Oral Take 40 mg by mouth daily before breakfast.    . CLOBETASOL PROPIONATE 0.05 % EX CREA Topical Apply topically 2 (two) times daily. Apply to affected area 30 g 0    BP 136/83  Pulse 76  Temp 98.7 F (37.1 C) (Oral)  Resp 18  Ht 6' (1.829 m)  Wt 249 lb (112.946 kg)  BMI 33.77 kg/m2  SpO2 96%  Physical Exam Nursing notes and Vital Signs reviewed. Appearance:  Patient appears healthy, stated age, and in no acute distress Eyes:  Pupils are equal, round, and reactive to light and accomodation.  Extraocular movement is intact.  Conjunctivae are not inflamed  Ears:  Canals normal.  Tympanic membranes normal.  Nose:  Mildly congested turbinates.  No sinus tenderness.  Pharynx:  Normal Neck:  Supple.   Tender shotty posterior nodes are palpated bilaterally  Lungs:  Clear to auscultation.  Breath sounds are equal.  Chest:  Distinct tenderness to palpation over the mid-sternum.  Heart:  Regular rate and rhythm without murmurs, rubs, or gallops.  Abdomen:  Nontender without masses or hepatosplenomegaly.  Bowel sounds are present.  No CVA or flank tenderness.  Extremities:  No edema.  No calf tenderness Skin:  No rash present.  ED Course  Procedures none   Labs Reviewed  POCT RAPID STREP A (OFFICE) - Normal      1. Throat swelling   2. Acute pharyngitis; suspect early viral URI       MDM  There is no evidence of bacterial infection today.   Treat symptomatically for now:  If increasing cold symptoms develop, take plain Mucinex (guaifenesin) twice daily for cough and congestion.  Increase fluid intake, rest. May use Afrin nasal spray (or generic oxymetazoline) twice daily for about 5 days.  Also recommend using saline nasal spray several times daily and saline nasal irrigation (AYR is a common brand) Stop all antihistamines for now, and other non-prescription cough/cold preparations. May take Ibuprofen 200mg , 4 tabs every 8 hours with food for sore throat and/or  chest/sternum discomfort. May take Delsym Cough Suppressant at bedtime for nighttime cough.  Follow-up with family doctor if not improving 7 to 10 days.         Lattie Haw, MD 10/24/11 2001

## 2011-10-23 NOTE — ED Notes (Signed)
Louis Phillips complains of dizziness and nausea yesterday. Today his tongue and throat feels "different". Denies fever, chills and sweats. He also has some trouble taking a deep breath.

## 2011-10-26 ENCOUNTER — Ambulatory Visit (INDEPENDENT_AMBULATORY_CARE_PROVIDER_SITE_OTHER): Payer: BC Managed Care – PPO | Admitting: Family Medicine

## 2011-10-26 ENCOUNTER — Encounter: Payer: Self-pay | Admitting: Family Medicine

## 2011-10-26 VITALS — BP 134/74 | HR 71 | Ht 72.0 in | Wt 246.0 lb

## 2011-10-26 DIAGNOSIS — M79609 Pain in unspecified limb: Secondary | ICD-10-CM

## 2011-10-26 DIAGNOSIS — Z23 Encounter for immunization: Secondary | ICD-10-CM

## 2011-10-26 DIAGNOSIS — I10 Essential (primary) hypertension: Secondary | ICD-10-CM

## 2011-10-26 DIAGNOSIS — R1013 Epigastric pain: Secondary | ICD-10-CM

## 2011-10-26 DIAGNOSIS — M79646 Pain in unspecified finger(s): Secondary | ICD-10-CM

## 2011-10-26 MED ORDER — ESOMEPRAZOLE MAGNESIUM 40 MG PO CPDR: 40.0000 mg | DELAYED_RELEASE_CAPSULE | Freq: Every day | ORAL | Status: DC

## 2011-10-26 NOTE — Progress Notes (Signed)
  Subjective:    Patient ID: Louis Phillips, male    DOB: 01-Mar-1980, 31 y.o.   MRN: [id number]  HPI Was having pain in the epigatric area and went to the Ed. Told to f/u with GI.  Seen at [facility], Dr. Quinn Axe.Sometime felt like something was in his throat.  They put him on dicyclomine and amitryptiline.  Had US done at the ED that was neg for Gallstones.  If meds don't work they plan on doing ERCP. Say will fell a lot of pressure in his chest but says feels like can't burp.  F/U in December 4th.  Has tried to drink more water and cut out soda. He would like to got back on the nexium. Feels it worked better than the prilosec that he is taking now.    HTN -No CP or SOB.  Taking meds without S.E.   Left hand ring finger with pain x 3 weeks. No trauma that he remembers. No swelling. No locking or popping. He has not taken any medications for pain relief. He says it really only bothers him if he pushes his finger to the side or gets caught on something and then he feels a sharp pain. He denies any swelling bruising or redness.  Review of Systems     Objective:   Physical Exam  Constitutional: He is oriented to person, place, and time. He appears well-developed and well-nourished.  HENT:  Head: Normocephalic and atraumatic.  Cardiovascular: Normal rate, regular rhythm and normal heart sounds.   Pulmonary/Chest: Effort normal and breath sounds normal.  Musculoskeletal:       Left hand, ring finger with normal range of motion. Strength is normal. No palpable swelling or lesions. No erythema.  Non tender over the actual joint.   Neurological: He is alert and oriented to person, place, and time.  Skin: Skin is warm and dry.  Psychiatric: He has a normal mood and affect. His behavior is normal.          Assessment & Plan:  HTN- Discussed need for weight loss. Work on diet and exercise. Continue regime.n. We will get blood work at the next visit. Followup in 6 months.  Next  Epigastric pain and discomfort. I did go ahead and send over new prescription for Nexium since he feels it works better than the Prilosec. There are prior authorization which may delay in getting a prescription but he does have enough Prilosec for now. He has followup with GI in December and they will likely do a HIDA scan at that point in time.  Ring finger strain-most likely strain based on his history and exam today. I do not see any evidence of a tear in the actual tendon. No pain or deformity over the actual joint. No swelling. Avoid reinjury and if not better in 2-3 weeks please let us know.

## 2011-11-12 ENCOUNTER — Other Ambulatory Visit: Payer: Self-pay | Admitting: Family Medicine

## 2012-01-08 ENCOUNTER — Encounter: Payer: Self-pay | Admitting: *Deleted

## 2012-01-08 ENCOUNTER — Emergency Department (INDEPENDENT_AMBULATORY_CARE_PROVIDER_SITE_OTHER)
Admission: EM | Admit: 2012-01-08 | Discharge: 2012-01-08 | Disposition: A | Discharge status: Home / Self Care | Payer: BC Managed Care – PPO | Source: Home / Self Care | Attending: Family Medicine | Admitting: Family Medicine

## 2012-01-08 DIAGNOSIS — J069 Acute upper respiratory infection, unspecified: Secondary | ICD-10-CM

## 2012-01-08 DIAGNOSIS — R6889 Other general symptoms and signs: Secondary | ICD-10-CM

## 2012-01-08 LAB — POCT INFLUENZA A/B
Influenza A, POC: NEGATIVE
Influenza B, POC: NEGATIVE

## 2012-01-08 LAB — POCT RAPID STREP A (OFFICE): Rapid Strep A Screen: NEGATIVE

## 2012-01-08 MED ORDER — OSELTAMIVIR PHOSPHATE 75 MG PO CAPS: 75.0000 mg | ORAL_CAPSULE | Freq: Two times a day (BID) | ORAL | Status: DC

## 2012-01-08 NOTE — ED Provider Notes (Signed)
History     CSN: [id number]  Arrival date & time 01/08/12  1231   First MD Initiated Contact with Patient 01/08/12 1256      Chief Complaint  Patient presents with  . Generalized Body Aches  . Sore Throat    HPI  URI Symptoms Onset: 2 days  Description: generalized malaise, body aches, chills, fever, rhinorrhea, nasal congestion Modifying factors:  + flu exposure, has had flu shot  Symptoms Nasal discharge: yes Fever: yes Sore throat: mild Cough: yes Wheezing: no Ear pain: no GI symptoms: no Sick contacts: yes  Red Flags  Stiff neck: no Dyspnea: no Rash: no Swallowing difficulty: no  Sinusitis Risk Factors Headache/face pain: no Double sickening: no tooth pain: no  Allergy Risk Factors Sneezing: no Itchy scratchy throat: no Seasonal symptoms: no  Flu Risk Factors Headache: yes muscle aches: yes severe fatigue: mild   Past Medical History  Diagnosis Date  . Anxiety   . Hypertension     Past Surgical History  Procedure Date  . Tonsillectomy 32 yrs old    Family History  Problem Relation Age of Onset  . Diabetes Father   . Hypertension Father   . Hyperlipidemia Father   . Other Maternal Grandmother 55    AMI  . Heart disease Maternal Grandmother 55    AMI  . Diabetes Mother   . Hyperlipidemia Mother   . Hypertension Mother     History  Substance Use Topics  . Smoking status: Never Smoker   . Smokeless tobacco: Never Used  . Alcohol Use: Yes     Comment: socially      Review of Systems  All other systems reviewed and are negative.    Allergies  Review of patient's allergies indicates no known allergies.  Home Medications   Current Outpatient Rx  Name  Route  Sig  Dispense  Refill  . AMITRIPTYLINE HCL 25 MG PO TABS   Oral   Take 25 mg by mouth at bedtime.         Marland Kitchen CLOBETASOL PROPIONATE 0.05 % EX CREA   Topical   Apply topically 2 (two) times daily. Apply to affected area   30 g   0   . DICYCLOMINE HCL 20 MG PO  TABS   Oral   Take 20 mg by mouth every 6 (six) hours.         . ENALAPRIL MALEATE 5 MG PO TABS      TAKE 1 TABLET (5 MG TOTAL) BY MOUTH DAILY.   90 tablet   1   . ESOMEPRAZOLE MAGNESIUM 40 MG PO CPDR   Oral   Take 1 capsule (40 mg total) by mouth daily before breakfast.   30 capsule   4   . OSELTAMIVIR PHOSPHATE 75 MG PO CAPS   Oral   Take 1 capsule (75 mg total) by mouth 2 (two) times daily.   10 capsule   0     BP 149/83  Pulse 104  Temp 99.6 F (37.6 C) (Oral)  Resp 18  Ht 6' (1.829 m)  Wt 254 lb (115.214 kg)  BMI 34.45 kg/m2  SpO2 95%  Physical Exam  Constitutional: He appears well-developed and well-nourished.  HENT:  Head: Normocephalic and atraumatic.  Right Ear: External ear normal.  Left Ear: External ear normal.       +nasal erythema, rhinorrhea bilaterally, + post oropharyngeal erythema    Eyes: Conjunctivae normal are normal. Pupils are equal, round, and reactive to  light.  Neck: Normal range of motion. Neck supple.  Cardiovascular: Normal rate, regular rhythm and normal heart sounds.   Pulmonary/Chest: Effort normal and breath sounds normal.  Abdominal: Soft.  Musculoskeletal: Normal range of motion.  Neurological: He is alert.  Skin: Skin is warm.    ED Course  Procedures (including critical care time)   Labs Reviewed  POCT RAPID STREP A (OFFICE)  POCT INFLUENZA A/B   No results found.   1. Flu-like symptoms   2. URI (upper respiratory infection)       MDM  Rapid flu and strep negative.  Will treat with tamiflu.  Discussed supportive care and infectious red flags.  Follow up as needed.     The patient and/or caregiver has been counseled thoroughly with regard to treatment plan and/or medications prescribed including dosage, schedule, interactions, rationale for use, and possible side effects and they verbalize understanding. Diagnoses and expected course of recovery discussed and will return if not improved as expected or  if the condition worsens. Patient and/or caregiver verbalized understanding.            Doree Albee, MD 01/08/12 1323

## 2012-01-08 NOTE — ED Notes (Signed)
Patient c/o sore throat, body aches, and productive cough x yesterday. Taken Mucinex D.

## 2012-02-08 ENCOUNTER — Ambulatory Visit (INDEPENDENT_AMBULATORY_CARE_PROVIDER_SITE_OTHER): Payer: BC Managed Care – PPO | Admitting: Family Medicine

## 2012-02-08 ENCOUNTER — Encounter: Payer: Self-pay | Admitting: Family Medicine

## 2012-02-08 VITALS — BP 122/80 | HR 64 | Ht 72.0 in | Wt 257.0 lb

## 2012-02-08 DIAGNOSIS — R109 Unspecified abdominal pain: Secondary | ICD-10-CM

## 2012-02-08 DIAGNOSIS — K219 Gastro-esophageal reflux disease without esophagitis: Secondary | ICD-10-CM

## 2012-02-08 DIAGNOSIS — I10 Essential (primary) hypertension: Secondary | ICD-10-CM

## 2012-02-08 DIAGNOSIS — R195 Other fecal abnormalities: Secondary | ICD-10-CM

## 2012-02-08 DIAGNOSIS — R0789 Other chest pain: Secondary | ICD-10-CM

## 2012-02-08 DIAGNOSIS — R1310 Dysphagia, unspecified: Secondary | ICD-10-CM

## 2012-02-08 LAB — CBC WITH DIFFERENTIAL/PLATELET
Basophils Absolute: 0.1 10*3/uL (ref 0.0–0.1)
Basophils Relative: 1 % (ref 0–1)
Eosinophils Absolute: 0.2 10*3/uL (ref 0.0–0.7)
Eosinophils Relative: 4 % (ref 0–5)
HCT: 42.4 % (ref 39.0–52.0)
Hemoglobin: 14.7 g/dL (ref 13.0–17.0)
Lymphocytes Relative: 35 % (ref 12–46)
Lymphs Abs: 2 10*3/uL (ref 0.7–4.0)
MCH: 30.9 pg (ref 26.0–34.0)
MCHC: 34.7 g/dL (ref 30.0–36.0)
MCV: 89.1 fL (ref 78.0–100.0)
Monocytes Absolute: 0.4 10*3/uL (ref 0.1–1.0)
Monocytes Relative: 7 % (ref 3–12)
Neutro Abs: 3.1 10*3/uL (ref 1.7–7.7)
Neutrophils Relative %: 53 % (ref 43–77)
Platelets: 216 10*3/uL (ref 150–400)
RBC: 4.76 MIL/uL (ref 4.22–5.81)
RDW: 12.9 % (ref 11.5–15.5)
WBC: 5.8 10*3/uL (ref 4.0–10.5)

## 2012-02-08 LAB — COMPLETE METABOLIC PANEL WITH GFR
ALT: 20 U/L (ref 0–53)
AST: 18 U/L (ref 0–37)
Albumin: 4.4 g/dL (ref 3.5–5.2)
Alkaline Phosphatase: 60 U/L (ref 39–117)
BUN: 17 mg/dL (ref 6–23)
CO2: 26 mEq/L (ref 19–32)
Calcium: 9.7 mg/dL (ref 8.4–10.5)
Chloride: 103 mEq/L (ref 96–112)
Creat: 1.1 mg/dL (ref 0.50–1.35)
GFR, Est African American: >89 mL/min
GFR, Est Non African American: >89 mL/min
Glucose, Bld: 98 mg/dL (ref 70–99)
Potassium: 4.2 mEq/L (ref 3.5–5.3)
Sodium: 139 mEq/L (ref 135–145)
Total Bilirubin: 0.5 mg/dL (ref 0.3–1.2)
Total Protein: 6.8 g/dL (ref 6.0–8.3)

## 2012-02-08 LAB — TSH: TSH: 2.152 u[IU]/mL (ref 0.350–4.500)

## 2012-02-08 MED ORDER — DEXLANSOPRAZOLE 60 MG PO CPDR: 60.0000 mg | DELAYED_RELEASE_CAPSULE | Freq: Every day | ORAL | Status: DC

## 2012-02-08 NOTE — Progress Notes (Signed)
Subjective:    Patient ID: Louis Phillips, male    DOB: 02/03/1980, 32 y.o.   MRN: [id number]  HPI pt states that he has been having dark stools since last sunday, he has not noticed any visible blood and he has BM at least .  No really taking any NSAIDs.  Stools are only passing small amount.  Was having some burning initially.  Also when he eats he feels like his food is getting stuck x a couple of weeks and has a gaging sensation this has been going on for 2-3 wks. Says will get choked and then gag. Feels food is getting stuck in mid-neck.  Never had this before.  Went to ER last weekend and told to take some pepto-bismol.  They ruled him out for MI.  They told him maybe a pulled muscle in chest.  Taking prilosec once a day for 4-5 months.  No abdominal pain or plevic pain.  Says has been getting some epigastric pain after eating.  Had to sleep upright last night and that helped. No fever. No vomiting.  He is not a smoker.  HTN- Pt denies chest pain, SOB, dizziness, or heart palpitations.  Taking meds as directed w/o problems.  Denies medication side effects.  He reports that his blood pressure was very high when he was in the emergency room. He does have a history of anxiety.   Review of Systems     Objective:   Physical Exam  Constitutional: He is oriented to person, place, and time. He appears well-developed and well-nourished.  HENT:  Head: Normocephalic and atraumatic.  Right Ear: External ear normal.  Left Ear: External ear normal.  Nose: Nose normal.  Mouth/Throat: Oropharynx is clear and moist.       TMs and canals are clear.   Eyes: Conjunctivae normal and EOM are normal. Pupils are equal, round, and reactive to light.  Neck: Neck supple. No thyromegaly present.  Cardiovascular: Normal rate and normal heart sounds.   Pulmonary/Chest: Effort normal and breath sounds normal.  Abdominal: Soft. Bowel sounds are normal. He exhibits no distension and no mass. There is tenderness.  There is no rebound and no guarding.       LUQ tenderness.   Lymphadenopathy:    He has no cervical adenopathy.  Neurological: He is alert and oriented to person, place, and time.  Skin: Skin is warm and dry.  Psychiatric: He has a normal mood and affect.          Assessment & Plan:  dypsphagia - at this point I suspect it could be severe reflux. It could also be a stricture. I like to refer him to GI for further evaluation. In the meantime we will change his over-the-counter Prilosec to dexilant. Samples given today. He did have normal pancreatic enzymes in the ED. We'll recheck his liver enzymes and a CBC today. He specifically requested to have his thyroid checked as well.   Atypcical chest paIn-I really do think this is reflux related. We discussed dietary changes to avoid exacerbating his symptoms. Handout given. We'll start Exelon as above. Avoid NSAIDs, et Karie Soda.  Dark stools - consider that he could have a bleeding gastric ulcer since most of his pain and discomfort seems to be epigastric in the lower chest. I would like for him to GI for further evaluation. And colon cancer this point is less likely but still a consideration.  Hypertension-well-controlled. He was worried because it was quite elevated in the  emergency room. It looks fantastic today. He still taking his enalapril daily.

## 2012-02-08 NOTE — Patient Instructions (Signed)
Diet for Gastroesophageal Reflux Disease, Adult  Reflux (acid reflux) is when acid from your stomach flows up into the esophagus. When acid comes in contact with the esophagus, the acid causes irritation and soreness (inflammation) in the esophagus. When reflux happens often or so severely that it causes damage to the esophagus, it is called gastroesophageal reflux disease (GERD). Nutrition therapy can help ease the discomfort of GERD.  FOODS OR DRINKS TO AVOID OR LIMIT   Smoking or chewing tobacco. Nicotine is one of the most potent stimulants to acid production in the gastrointestinal tract.   Caffeinated and decaffeinated coffee and black tea.   Regular or low-calorie carbonated beverages or energy drinks (caffeine-free carbonated beverages are allowed).    Strong spices, such as black pepper, white pepper, red pepper, cayenne, curry powder, and chili powder.   Peppermint or spearmint.   Chocolate.   High-fat foods, including meats and fried foods. Extra added fats including oils, butter, salad dressings, and nuts. Limit these to less than 8 tsp per day.   Fruits and vegetables if they are not tolerated, such as citrus fruits or tomatoes.   Alcohol.   Any food that seems to aggravate your condition.  If you have questions regarding your diet, call your caregiver or a registered dietitian.  OTHER THINGS THAT MAY HELP GERD INCLUDE:    Eating your meals slowly, in a relaxed setting.   Eating 5 to 6 small meals per day instead of 3 large meals.   Eliminating food for a period of time if it causes distress.   Not lying down until 3 hours after eating a meal.   Keeping the head of your bed raised 6 to 9 inches (15 to 23 cm) by using a foam wedge or blocks under the legs of the bed. Lying flat may make symptoms worse.   Being physically active. Weight loss may be helpful in reducing reflux in overweight or obese adults.   Wear loose fitting clothing  EXAMPLE MEAL PLAN  This meal plan is approximately  2,000 calories based on [employer]My[employer].gov meal planning guidelines.  Breakfast    cup cooked oatmeal.   1 cup strawberries.   1 cup low-fat milk.   1 oz almonds.  Snack   1 cup cucumber slices.   6 oz yogurt (made from low-fat or fat-free milk).  Lunch   2 slice whole-wheat bread.   2 oz sliced turkey.   2 tsp mayonnaise.   1 cup blueberries.   1 cup snap peas.  Snack   6 whole-wheat crackers.   1 oz string cheese.  Dinner    cup brown rice.   1 cup mixed veggies.   1 tsp olive oil.   3 oz grilled fish.  Document Released: 12/18/2004 Document Revised: 03/12/2011 Document Reviewed: 11/03/2010  [employer] Patient Information 2013 [employer].

## 2012-02-09 NOTE — Progress Notes (Signed)
Quick Note:  All labs are normal. ______ 

## 2012-04-25 ENCOUNTER — Ambulatory Visit (INDEPENDENT_AMBULATORY_CARE_PROVIDER_SITE_OTHER): Payer: BC Managed Care – PPO | Admitting: Family Medicine

## 2012-04-25 ENCOUNTER — Encounter: Payer: Self-pay | Admitting: Family Medicine

## 2012-04-25 VITALS — BP 120/74 | HR 83 | Ht 72.0 in | Wt 248.0 lb

## 2012-04-25 DIAGNOSIS — I10 Essential (primary) hypertension: Secondary | ICD-10-CM

## 2012-04-25 DIAGNOSIS — E781 Pure hyperglyceridemia: Secondary | ICD-10-CM

## 2012-04-25 MED ORDER — DEXLANSOPRAZOLE 60 MG PO CPDR: 60.0000 mg | DELAYED_RELEASE_CAPSULE | Freq: Every day | ORAL | Status: DC

## 2012-04-25 NOTE — Progress Notes (Signed)
  Subjective:    Patient ID: Louis Phillips, male    DOB: 09-28-1980, 32 y.o.   MRN: [id number]  HPI HTN -  Pt denies chest pain, SOB, dizziness, or heart palpitations.  Taking meds as directed w/o problems.  Denies medication side effects.  Some exercise.  Physical job.  Eats low salt diet.    Depression/Anxiety - Doing well.  Not feeling down. No major stressors.    Review of Systems     Objective:   Physical Exam  Constitutional: He is oriented to person, place, and time. He appears well-developed and well-nourished.  HENT:  Head: Normocephalic and atraumatic.  Cardiovascular: Normal rate, regular rhythm and normal heart sounds.   Pulmonary/Chest: Effort normal and breath sounds normal.  Neurological: He is alert and oriented to person, place, and time.  Skin: Skin is warm and dry.  Psychiatric: He has a normal mood and affect. His behavior is normal.          Assessment & Plan:  HTN - Well controlled. Congratulated him on 9 lb weight loss.    Depression - Well controlled  Hypretriglyeridemia - Due to recheck.  contnue to work  On weight loss, diet and exercise.

## 2012-04-30 ENCOUNTER — Emergency Department
Admission: EM | Admit: 2012-04-30 | Discharge: 2012-04-30 | Disposition: A | Discharge status: Home / Self Care | Payer: BC Managed Care – PPO | Source: Home / Self Care | Attending: Family Medicine | Admitting: Family Medicine

## 2012-04-30 ENCOUNTER — Encounter: Payer: Self-pay | Admitting: *Deleted

## 2012-04-30 DIAGNOSIS — A088 Other specified intestinal infections: Secondary | ICD-10-CM

## 2012-04-30 DIAGNOSIS — A084 Viral intestinal infection, unspecified: Secondary | ICD-10-CM

## 2012-04-30 MED ORDER — ONDANSETRON 4 MG PO TBDP: 4.0000 mg | ORAL_TABLET | Freq: Three times a day (TID) | ORAL | Status: DC | PRN

## 2012-04-30 NOTE — ED Provider Notes (Signed)
History     CSN: [id number]  Arrival date & time 04/30/12  1546   First MD Initiated Contact with Patient 04/30/12 1557      Chief Complaint  Patient presents with  . Abdominal Pain  . Nausea  . Diarrhea   HPI  Diarrhea: Onset: 1-2 days  Description: diarrhea, nausea, mild abd pain  Modifying factors: non   Symptoms: Incontinence: no Vomiting: no Abdominal Pain: mild, generalized Urgency: yes Relief with defecation: yes Weight loss: no Decreased urine output: no Lightheadedness: no Recent travel history: no Sick contacts: yes; with similar sxs  Suspicious food or water: no Change in diet: no    Red Flags: Fever: no Bloody stools: no Recent antibiotics: no Immuncompromised: no   Past Medical History  Diagnosis Date  . Anxiety   . Hypertension     Past Surgical History  Procedure Laterality Date  . Tonsillectomy  32 yrs old  . Back surgery      Family History  Problem Relation Age of Onset  . Diabetes Father   . Hypertension Father   . Hyperlipidemia Father   . Other Maternal Grandmother 55    AMI  . Heart disease Maternal Grandmother 55    AMI  . Diabetes Mother   . Hyperlipidemia Mother   . Hypertension Mother     History  Substance Use Topics  . Smoking status: Never Smoker   . Smokeless tobacco: Never Used  . Alcohol Use: Yes     Comment: socially      Review of Systems  All other systems reviewed and are negative.    Allergies  Review of patient's allergies indicates no known allergies.  Home Medications   Current Outpatient Rx  Name  Route  Sig  Dispense  Refill  . amitriptyline (ELAVIL) 25 MG tablet   Oral   Take 25 mg by mouth at bedtime.         Marland Kitchen dexlansoprazole (DEXILANT) 60 MG capsule   Oral   Take 1 capsule (60 mg total) by mouth daily.   30 capsule   6   . dicyclomine (BENTYL) 20 MG tablet   Oral   Take 20 mg by mouth every 6 (six) hours.         . enalapril (VASOTEC) 5 MG tablet      TAKE 1  TABLET (5 MG TOTAL) BY MOUTH DAILY.   90 tablet   1     BP 129/74  Pulse 70  Temp(Src) 98.6 F (37 C) (Oral)  Resp 16  Ht 6' (1.829 m)  Wt 246 lb (111.585 kg)  BMI 33.36 kg/m2  SpO2 97%  Physical Exam  Constitutional: He appears well-developed and well-nourished.  HENT:  Head: Normocephalic and atraumatic.  Eyes: Conjunctivae are normal. Pupils are equal, round, and reactive to light.  Neck: Normal range of motion. Neck supple.  Cardiovascular: Normal rate, regular rhythm and normal heart sounds.   Pulmonary/Chest: Effort normal.  Abdominal: Soft.  Hyperactive bowel sounds Minimal tenderness diffusely   Musculoskeletal: Normal range of motion.  Neurological: He is alert.  Skin: Skin is warm.    ED Course  Procedures (including critical care time)  Labs Reviewed - No data to display No results found.   1. Viral gastroenteritis       MDM  Discussed supportive care and GI/infectious red flags.  Zofran for nausea.  Follow up as needed.     The patient and/or caregiver has been counseled thoroughly with regard  to treatment plan and/or medications prescribed including dosage, schedule, interactions, rationale for use, and possible side effects and they verbalize understanding. Diagnoses and expected course of recovery discussed and will return if not improved as expected or if the condition worsens. Patient and/or caregiver verbalized understanding.             Doree Albee, MD 04/30/12 [phone]

## 2012-04-30 NOTE — ED Notes (Signed)
Pt c/o nausea, generalized abd pain and diarrhea x 1 day. Denies fever. He has taken pepto bismol.

## 2012-05-02 ENCOUNTER — Telehealth: Payer: Self-pay | Admitting: Emergency Medicine

## 2012-05-16 LAB — LIPID PANEL
Cholesterol: 178 mg/dL (ref 0–200)
HDL: 29 mg/dL — ABNORMAL LOW (ref >39)
LDL Cholesterol: 114 mg/dL — ABNORMAL HIGH (ref 0–99)
Total CHOL/HDL Ratio: 6.1 Ratio
Triglycerides: 174 mg/dL — ABNORMAL HIGH (ref <150)
VLDL: 35 mg/dL (ref 0–40)

## 2012-06-12 ENCOUNTER — Other Ambulatory Visit: Payer: Self-pay | Admitting: Family Medicine

## 2012-06-13 ENCOUNTER — Other Ambulatory Visit: Payer: Self-pay | Admitting: *Deleted

## 2012-06-13 MED ORDER — DEXLANSOPRAZOLE 60 MG PO CPDR: 60.0000 mg | DELAYED_RELEASE_CAPSULE | Freq: Every day | ORAL | Status: DC

## 2012-07-16 ENCOUNTER — Emergency Department
Admission: EM | Admit: 2012-07-16 | Discharge: 2012-07-16 | Disposition: A | Discharge status: Home / Self Care | Payer: BC Managed Care – PPO | Source: Home / Self Care | Attending: Family Medicine | Admitting: Family Medicine

## 2012-07-16 ENCOUNTER — Encounter: Payer: Self-pay | Admitting: *Deleted

## 2012-07-16 DIAGNOSIS — R110 Nausea: Secondary | ICD-10-CM

## 2012-07-16 DIAGNOSIS — R197 Diarrhea, unspecified: Secondary | ICD-10-CM

## 2012-07-16 NOTE — ED Notes (Signed)
Patient c/o diarrhea, vomiting, nausea since yesterday evening. Patient states has tried OTC Pepto bismol with relief.

## 2012-07-16 NOTE — ED Provider Notes (Signed)
History    CSN: [id number] Arrival date & time 07/16/12  1629  First MD Initiated Contact with Patient 07/16/12 1708     Chief Complaint  Patient presents with  . Emesis  . Diarrhea      HPI Comments: Patient developed watery diarrhea yesterday.  At 10pm he developed nausea and an episode of vomiting, now resolved.  He has had chills but no fever.  No abdominal pain. Denies recent foreign travel, or drinking untreated water in a wilderness environment.   Patient is a 32 y.o. male presenting with diarrhea. The history is provided by the patient.  Diarrhea Quality:  Watery Severity:  Mild Onset quality:  Sudden Duration:  1 day Timing:  Intermittent Progression:  Improving Relieved by:  Nothing Worsened by:  Nothing tried Ineffective treatments: Pepto bismol. Associated symptoms: chills and vomiting   Associated symptoms: no abdominal pain, no arthralgias, no recent cough, no fever, no headaches, no myalgias and no URI   Risk factors: no recent antibiotic use, no sick contacts, no suspicious food intake and no travel to endemic areas    Past Medical History  Diagnosis Date  . Anxiety   . Hypertension    Past Surgical History  Procedure Laterality Date  . Tonsillectomy  32 yrs old  . Back surgery     Family History  Problem Relation Age of Onset  . Diabetes Father   . Hypertension Father   . Hyperlipidemia Father   . Other Maternal Grandmother 55    AMI  . Heart disease Maternal Grandmother 55    AMI  . Diabetes Mother   . Hyperlipidemia Mother   . Hypertension Mother    History  Substance Use Topics  . Smoking status: Never Smoker   . Smokeless tobacco: Never Used  . Alcohol Use: Yes     Comment: socially    Review of Systems  Constitutional: Positive for chills. Negative for fever.  Gastrointestinal: Positive for vomiting and diarrhea. Negative for abdominal pain.  Musculoskeletal: Negative for myalgias and arthralgias.  Neurological: Negative for  headaches.  All other systems reviewed and are negative.    Allergies  Review of patient's allergies indicates no known allergies.  Home Medications   Current Outpatient Rx  Name  Route  Sig  Dispense  Refill  . enalapril (VASOTEC) 5 MG tablet      TAKE 1 TABLET (5 MG TOTAL) BY MOUTH DAILY.   90 tablet   0   . amitriptyline (ELAVIL) 25 MG tablet   Oral   Take 25 mg by mouth at bedtime.         Marland Kitchen dexlansoprazole (DEXILANT) 60 MG capsule   Oral   Take 1 capsule (60 mg total) by mouth daily.   90 capsule   2   . dicyclomine (BENTYL) 20 MG tablet   Oral   Take 20 mg by mouth every 6 (six) hours.         . ondansetron (ZOFRAN ODT) 4 MG disintegrating tablet   Oral   Take 1 tablet (4 mg total) by mouth every 8 (eight) hours as needed for nausea.   20 tablet   0    BP 116/72  Pulse 56  Temp(Src) 97.8 F (36.6 C) (Oral)  Resp 16  Ht 6' (1.829 m)  Wt 245 lb (111.131 kg)  BMI 33.22 kg/m2  SpO2 98% Physical Exam Nursing notes and Vital Signs reviewed. Appearance:  Patient appears stated age, and in no acute distress.  Patient is obese (BMI 33.2) Eyes:  Pupils are equal, round, and reactive to light and accomodation.  Extraocular movement is intact.  Conjunctivae are not inflamed  Nose:  Normal Mouth/Pharynx:  Normal; moist mucous membranes  Neck:  Supple.  No adenopathy Lungs:  Clear to auscultation.  Breath sounds are equal.  Heart:  Regular rate and rhythm without murmurs, rubs, or gallops.  Abdomen:  Nontender without masses or hepatosplenomegaly.  Bowel sounds are present.  No CVA or flank tenderness.  Extremities:  No edema.  No calf tenderness Skin:  No rash present.   ED Course  Procedures  none   1. Nausea alone   2. Diarrhea; suspect viral gastroenteritis     MDM   Begin clear liquids (Pedialyte while having diarrhea) until improved, then advance to a BRAT diet.  Then gradually resume a regular diet when tolerated.  Avoid milk products until  well.  To decrease diarrhea, mix one heaping tablespoon Citrucel (methylcellulose) in 8 oz water and drink one to three times daily.  When stools become more formed, may take Imodium (loperamide) once or twice daily to decrease stool frequency.  If symptoms become significantly worse during the night or over the weekend, proceed to the local emergency room.  Followup with Family Doctor if not improved in about 5 days  Lattie Haw, MD 07/16/12 [phone]

## 2012-09-07 ENCOUNTER — Other Ambulatory Visit: Payer: Self-pay | Admitting: Family Medicine

## 2012-09-29 ENCOUNTER — Encounter: Payer: Self-pay | Admitting: Family Medicine

## 2012-09-29 ENCOUNTER — Encounter: Payer: Self-pay | Admitting: *Deleted

## 2012-09-29 ENCOUNTER — Ambulatory Visit (INDEPENDENT_AMBULATORY_CARE_PROVIDER_SITE_OTHER): Payer: BC Managed Care – PPO | Admitting: Family Medicine

## 2012-09-29 VITALS — BP 132/77 | HR 98 | Temp 98.9°F | Wt 248.0 lb

## 2012-09-29 DIAGNOSIS — IMO0001 Reserved for inherently not codable concepts without codable children: Secondary | ICD-10-CM

## 2012-09-29 DIAGNOSIS — R110 Nausea: Secondary | ICD-10-CM

## 2012-09-29 DIAGNOSIS — R1084 Generalized abdominal pain: Secondary | ICD-10-CM

## 2012-09-29 LAB — POC INFLUENZA A&B (BINAX/QUICKVUE)
Influenza A, POC: NEGATIVE
Influenza B, POC: NEGATIVE

## 2012-09-29 MED ORDER — PROMETHAZINE HCL 25 MG PO TABS: 25.0000 mg | ORAL_TABLET | Freq: Four times a day (QID) | ORAL | Status: DC | PRN

## 2012-09-29 NOTE — Progress Notes (Signed)
  Subjective:    Patient ID: Louis Phillips, male    DOB: 1980/07/21, 32 y.o.   MRN: [id number]  HPI Body aches x 2 wks taking OTC meds not working coughed up brown mucous.  + nausea, chills.  Says woke with chills last night. No GI sxs.  NO ST or ear pain.  + runny nose.  No cough.  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 Feels like he hydrated really well. + sick contacts. He did feel a little better after taking ibuprofen as morning. He is also had a mild headache. No sinus congestion. He says his abdomen feels sore.   Review of Systems     Objective:   Physical Exam  Constitutional: He is oriented to person, place, and time. He appears well-developed and well-nourished.  HENT:  Head: Normocephalic and atraumatic.  Right Ear: External ear normal.  Left Ear: External ear normal.  Nose: Nose normal.  Mouth/Throat: Oropharynx is clear and moist.  TMs and canals are clear.   Eyes: Conjunctivae and EOM are normal. Pupils are equal, round, and reactive to light.  Neck: Neck supple. No thyromegaly present.  Cardiovascular: Normal rate and normal heart sounds.   Pulmonary/Chest: Effort normal and breath sounds normal.  Abdominal: He exhibits no distension and no mass. There is tenderness. There is no rebound and no guarding.  Generalized tenderness. There was not one specific area location that seemed worse than another.  Lymphadenopathy:    He has no cervical adenopathy.  Neurological: He is alert and oriented to person, place, and time.  Skin: Skin is warm and dry.  Psychiatric: He has a normal mood and affect.          Assessment & Plan:  Likely viral illness with body aches, runny nose and nausea. Exam is negative. Flu test was negative. We'll send for CBC and CK. He did work out the yard all day yesterday and certainly mild dehydration with some rhabdomyolysis as possible as well. We'll call with results once available. I will send her a prescription for Phenergan as needed for  nausea. Please call if fever or suddenly getting worse. We did give him 600 mg ibuprofen here in the office for relief.

## 2012-09-29 NOTE — Patient Instructions (Addendum)
Call if suddenly feeling worse or spikes a fever. We should have the bloodwork back first thing tomorrow morning he'll call.

## 2012-09-30 LAB — CK: Total CK: 113 U/L (ref 7–232)

## 2012-09-30 LAB — CBC WITH DIFFERENTIAL/PLATELET
Basophils Absolute: 0 10*3/uL (ref 0.0–0.1)
Basophils Relative: 0 % (ref 0–1)
Eosinophils Absolute: 0.1 10*3/uL (ref 0.0–0.7)
Eosinophils Relative: 1 % (ref 0–5)
HCT: 41.1 % (ref 39.0–52.0)
Hemoglobin: 14.7 g/dL (ref 13.0–17.0)
Lymphocytes Relative: 4 % — ABNORMAL LOW (ref 12–46)
Lymphs Abs: 0.4 10*3/uL — ABNORMAL LOW (ref 0.7–4.0)
MCH: 32.7 pg (ref 26.0–34.0)
MCHC: 35.8 g/dL (ref 30.0–36.0)
MCV: 91.5 fL (ref 78.0–100.0)
Monocytes Absolute: 0.7 10*3/uL (ref 0.1–1.0)
Monocytes Relative: 7 % (ref 3–12)
Neutro Abs: 8.5 10*3/uL — ABNORMAL HIGH (ref 1.7–7.7)
Neutrophils Relative %: 88 % — ABNORMAL HIGH (ref 43–77)
Platelets: 185 10*3/uL (ref 150–400)
RBC: 4.49 MIL/uL (ref 4.22–5.81)
RDW: 12.9 % (ref 11.5–15.5)
WBC: 9.6 10*3/uL (ref 4.0–10.5)

## 2012-09-30 LAB — COMPLETE METABOLIC PANEL WITH GFR
ALT: 15 U/L (ref 0–53)
AST: 20 U/L (ref 0–37)
Albumin: 4.5 g/dL (ref 3.5–5.2)
Alkaline Phosphatase: 56 U/L (ref 39–117)
BUN: 24 mg/dL — ABNORMAL HIGH (ref 6–23)
CO2: 26 mEq/L (ref 19–32)
Calcium: 9.3 mg/dL (ref 8.4–10.5)
Chloride: 104 mEq/L (ref 96–112)
Creat: 0.95 mg/dL (ref 0.50–1.35)
GFR, Est African American: >89 mL/min
GFR, Est Non African American: >89 mL/min
Glucose, Bld: 108 mg/dL — ABNORMAL HIGH (ref 70–99)
Potassium: 4.1 mEq/L (ref 3.5–5.3)
Sodium: 140 mEq/L (ref 135–145)
Total Bilirubin: 1.3 mg/dL — ABNORMAL HIGH (ref 0.3–1.2)
Total Protein: 7.1 g/dL (ref 6.0–8.3)

## 2012-10-24 ENCOUNTER — Ambulatory Visit (INDEPENDENT_AMBULATORY_CARE_PROVIDER_SITE_OTHER): Payer: BC Managed Care – PPO | Admitting: Family Medicine

## 2012-10-24 ENCOUNTER — Encounter: Payer: Self-pay | Admitting: Family Medicine

## 2012-10-24 VITALS — BP 121/74 | HR 81 | Wt 256.0 lb

## 2012-10-24 DIAGNOSIS — L237 Allergic contact dermatitis due to plants, except food: Secondary | ICD-10-CM

## 2012-10-24 DIAGNOSIS — I10 Essential (primary) hypertension: Secondary | ICD-10-CM

## 2012-10-24 DIAGNOSIS — L255 Unspecified contact dermatitis due to plants, except food: Secondary | ICD-10-CM

## 2012-10-24 DIAGNOSIS — Z23 Encounter for immunization: Secondary | ICD-10-CM

## 2012-10-24 MED ORDER — CLOBETASOL PROPIONATE 0.05 % EX CREA: TOPICAL_CREAM | Freq: Two times a day (BID) | CUTANEOUS | Status: DC

## 2012-10-24 NOTE — Progress Notes (Signed)
  Subjective:    Patient ID: Louis Phillips, male    DOB: 12/03/1980, 32 y.o.   MRN: [id number]  HPI HTN -  Pt denies chest pain, SOB, dizziness, or heart palpitations.  Taking meds as directed w/o problems.  Denies medication side effects.   Poison ivy-recently moved into a new house. Can try to clear out poison Burn[place] Sabin and got some on his forearms and around his neck. Been very itchy. He's been using an over-the-counter anti-itch cream it only helps minimally. He has had a topical steroid that he has used in the past but is completely out.  Review of Systems     Objective:   Physical Exam  Constitutional: He is oriented to person, place, and time. He appears well-developed and well-nourished.  HENT:  Head: Normocephalic and atraumatic.  Cardiovascular: Normal rate, regular rhythm and normal heart sounds.   Pulmonary/Chest: Effort normal and breath sounds normal.  Musculoskeletal: He exhibits no edema.  Neurological: He is alert and oriented to person, place, and time.  Skin: Skin is warm and dry.  Erythematous papules on the creases of the forearms as well as along the right side of the neck. No excoriations or drainage or evidence of cellulitis.  Psychiatric: He has a normal mood and affect. His behavior is normal.          Assessment & Plan:  HTN- well controlled.  Followup in 6 months. He doesn't think he needs refills right now. Call if any problems.  Poison ivy-will treat with topical steroid cream since he just has a few locations.

## 2012-11-24 ENCOUNTER — Emergency Department
Admission: EM | Admit: 2012-11-24 | Discharge: 2012-11-24 | Disposition: A | Discharge status: Home / Self Care | Payer: BC Managed Care – PPO | Source: Home / Self Care | Attending: Family Medicine | Admitting: Family Medicine

## 2012-11-24 ENCOUNTER — Encounter: Payer: Self-pay | Admitting: Emergency Medicine

## 2012-11-24 DIAGNOSIS — H6592 Unspecified nonsuppurative otitis media, left ear: Secondary | ICD-10-CM

## 2012-11-24 DIAGNOSIS — J069 Acute upper respiratory infection, unspecified: Secondary | ICD-10-CM

## 2012-11-24 DIAGNOSIS — H6590 Unspecified nonsuppurative otitis media, unspecified ear: Secondary | ICD-10-CM

## 2012-11-24 MED ORDER — BENZONATATE 200 MG PO CAPS: 200.0000 mg | ORAL_CAPSULE | Freq: Every day | ORAL | Status: DC

## 2012-11-24 MED ORDER — AMOXICILLIN 875 MG PO TABS: 875.0000 mg | ORAL_TABLET | Freq: Two times a day (BID) | ORAL | Status: DC

## 2012-11-24 NOTE — ED Notes (Addendum)
Rasheed c/o productive cough (green) congestion and post nasal drainage, hoarseness without fever x yesterday. He did have a flu vaccine this year.

## 2012-11-24 NOTE — ED Provider Notes (Signed)
CSN: [id number]     Arrival date & time 11/24/12  1842 History   First MD Initiated Contact with Patient 11/24/12 1930     Chief Complaint  Patient presents with  . URI      HPI Comments: Patient developed fatigue two days ago, and yesterday developed sinus congestion, sore throat, non-productive cough, sweats, and headache.  The history is provided by the patient.    Past Medical History  Diagnosis Date  . Anxiety   . Hypertension    Past Surgical History  Procedure Laterality Date  . Tonsillectomy  32 yrs old  . Back surgery     Family History  Problem Relation Age of Onset  . Diabetes Father   . Hypertension Father   . Hyperlipidemia Father   . Other Maternal Grandmother 55    AMI  . Heart disease Maternal Grandmother 55    AMI  . Diabetes Mother   . Hyperlipidemia Mother   . Hypertension Mother    History  Substance Use Topics  . Smoking status: Never Smoker   . Smokeless tobacco: Never Used  . Alcohol Use: Yes     Comment: socially    Review of Systems + sore throat + cough No pleuritic pain No wheezing + nasal congestion + post-nasal drainage No sinus pain/pressure ? itchy/red eyes ? earache No hemoptysis No SOB No fever, + sweats No nausea No vomiting No abdominal pain No diarrhea No urinary symptoms No skin rash + fatigue No myalgias + headache Used OTC meds without relief  Allergies  Review of patient's allergies indicates no known allergies.  Home Medications   Current Outpatient Rx  Name  Route  Sig  Dispense  Refill  . amitriptyline (ELAVIL) 25 MG tablet   Oral   Take 25 mg by mouth at bedtime.         Marland Kitchen amoxicillin (AMOXIL) 875 MG tablet   Oral   Take 1 tablet (875 mg total) by mouth 2 (two) times daily.   20 tablet   0   . benzonatate (TESSALON) 200 MG capsule   Oral   Take 1 capsule (200 mg total) by mouth at bedtime. Take as needed for cough   12 capsule   0   . enalapril (VASOTEC) 5 MG tablet      TAKE 1  TABLET (5 MG TOTAL) BY MOUTH DAILY.   90 tablet   0    BP 144/87  Pulse 82  Temp(Src) 98.9 F (37.2 C) (Oral)  Resp 14  Wt 256 lb (116.121 kg)  SpO2 96% Physical Exam Nursing notes and Vital Signs reviewed. Appearance:  Patient appears healthy, stated age, and in no acute distress Eyes:  Pupils are equal, round, and reactive to light and accomodation.  Extraocular movement is intact.  Conjunctivae are not inflamed  Ears:  Canals normal.  Right tympanic membrane normal.  Left tympanic membrane erythematous with a small effusion.  Nose:  Congested turbinates, worse on the left.  No sinus tenderness.     Pharynx:  Normal Neck:  Supple.   Tender shotty posterior nodes are palpated bilaterally  Lungs:  Clear to auscultation.  Breath sounds are equal.  Heart:  Regular rate and rhythm without murmurs, rubs, or gallops.  Abdomen:  Nontender without masses or hepatosplenomegaly.  Bowel sounds are present.  No CVA or flank tenderness.  Extremities:  No edema.  No calf tenderness Skin:  No rash present.   ED Course  Procedures  none  MDM   1. Acute upper respiratory infections of unspecified site; suspect early viral URI   2. Left serous otitis media    Begin amoxicillin.  Prescription written for Benzonatate [facility] to take at bedtime for night-time cough.  Take plain Mucinex (guaifenesin) twice daily for cough and congestion.  May add Sudafed for congestion.  Increase fluid intake, rest. May use Afrin nasal spray (or generic oxymetazoline) twice daily for about 5 days.  Also recommend using saline nasal spray several times daily and saline nasal irrigation (AYR is a common brand) Stop all antihistamines for now, and other non-prescription cough/cold preparations. Follow-up with family doctor if not improving about10 days.      Lattie Haw, MD 11/24/12 2005

## 2012-12-04 ENCOUNTER — Other Ambulatory Visit: Payer: Self-pay | Admitting: Family Medicine

## 2013-02-01 ENCOUNTER — Other Ambulatory Visit: Payer: Self-pay | Admitting: Family Medicine

## 2013-04-24 ENCOUNTER — Encounter: Payer: Self-pay | Admitting: Family Medicine

## 2013-04-24 ENCOUNTER — Ambulatory Visit (INDEPENDENT_AMBULATORY_CARE_PROVIDER_SITE_OTHER): Payer: BC Managed Care – PPO | Admitting: Family Medicine

## 2013-04-24 VITALS — BP 126/73 | HR 78 | Ht 72.0 in | Wt 264.0 lb

## 2013-04-24 DIAGNOSIS — I10 Essential (primary) hypertension: Secondary | ICD-10-CM

## 2013-04-24 DIAGNOSIS — S139XXA Sprain of joints and ligaments of unspecified parts of neck, initial encounter: Secondary | ICD-10-CM

## 2013-04-24 DIAGNOSIS — M25539 Pain in unspecified wrist: Secondary | ICD-10-CM

## 2013-04-24 DIAGNOSIS — S161XXA Strain of muscle, fascia and tendon at neck level, initial encounter: Secondary | ICD-10-CM

## 2013-04-24 NOTE — Progress Notes (Signed)
   Subjective:    Patient ID: Louis Phillips, male    DOB: 08/21/1980, 33 y.o.   MRN: [id number]  HPI pt c/o joint pain x 2-3 mos in both wrists and neck  Neck feels sore and tense at times.  Sometime will be sore when he wakes up.  Uses tylenol and massages it.  No injury. 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   Bilat wrist pain comes and goes and sometimes even feels sore into his hands.  Sometimes his PIPs are painful. No swelling. Father has rheumatoid arthritis.     Hypertension- Pt denies chest pain, SOB, dizziness, or heart palpitations.  Taking meds as directed w/o problems.  Denies medication side effects.   Review of Systems     Objective:   Physical Exam  Constitutional: He is oriented to person, place, and time. He appears well-developed and well-nourished.  HENT:  Head: Normocephalic and atraumatic.  Neck: Neck supple. No thyromegaly present.  Cardiovascular: Normal rate, regular rhythm and normal heart sounds.   Pulmonary/Chest: Effort normal and breath sounds normal.  Musculoskeletal:  Neck with normal flexion, extension, rotation right and left, side bending. Shoulders with normal range of motion. Wrist with normal range of motion and strength. Nontender over the wrist joints or the MCPs, DIPs or PIPs. No swelling or erythema. Negative squeeze test of the hand.  Lymphadenopathy:    He has no cervical adenopathy.  Neurological: He is alert and oriented to person, place, and time.  Skin: Skin is warm and dry.  Psychiatric: He has a normal mood and affect. His behavior is normal.          Assessment & Plan:  Hypertension-well-controlled. Continue current regimen. Followup in 6 months. Due for CMP and fasting lipid panel.   Neck strain - recommend anti-inflammatory as needed. Explained to him that Tylenol is a good pain reliever but doesn't really have the anti-inflammatory effects that ibuprofen and Aleve do. Encouraged him to choose one of those products. given handout  on exercises for cervical strain. Recommend more supportive pillow. Also can add heating pad or ice pack in the evenings for pain and discomfort. Work on gentle stretches. Consider muscle relaxer if pain persists. No trauma or injury I did not order x-rays today.  Wrist strain- suspect muscle strain/tendinitis from repetitive motion and heavy lifting at work. He's not in any discomfort or pain today and exam is normal today. Again recommend anti-inflammatory as needed.

## 2013-04-24 NOTE — Patient Instructions (Signed)
Recommend aleve, Ibuprofen.  Work on stretches.

## 2013-05-06 LAB — LIPID PANEL
Cholesterol: 179 mg/dL (ref 0–200)
HDL: 35 mg/dL — ABNORMAL LOW (ref >39)
LDL Cholesterol: 113 mg/dL — ABNORMAL HIGH (ref 0–99)
Total CHOL/HDL Ratio: 5.1 Ratio
Triglycerides: 155 mg/dL — ABNORMAL HIGH (ref <150)
VLDL: 31 mg/dL (ref 0–40)

## 2013-05-06 LAB — COMPLETE METABOLIC PANEL WITH GFR
ALT: 46 U/L (ref 0–53)
AST: 42 U/L — ABNORMAL HIGH (ref 0–37)
Albumin: 4.3 g/dL (ref 3.5–5.2)
Alkaline Phosphatase: 53 U/L (ref 39–117)
BUN: 21 mg/dL (ref 6–23)
CO2: 27 mEq/L (ref 19–32)
Calcium: 9 mg/dL (ref 8.4–10.5)
Chloride: 105 mEq/L (ref 96–112)
Creat: 1.15 mg/dL (ref 0.50–1.35)
GFR, Est African American: >89 mL/min
GFR, Est Non African American: 85 mL/min
Glucose, Bld: 87 mg/dL (ref 70–99)
Potassium: 4.3 mEq/L (ref 3.5–5.3)
Sodium: 139 mEq/L (ref 135–145)
Total Bilirubin: 0.7 mg/dL (ref 0.2–1.2)
Total Protein: 6.8 g/dL (ref 6.0–8.3)

## 2013-05-08 ENCOUNTER — Other Ambulatory Visit: Payer: Self-pay | Admitting: *Deleted

## 2013-05-08 DIAGNOSIS — R748 Abnormal levels of other serum enzymes: Secondary | ICD-10-CM

## 2013-05-18 ENCOUNTER — Ambulatory Visit (INDEPENDENT_AMBULATORY_CARE_PROVIDER_SITE_OTHER): Payer: BC Managed Care – PPO | Admitting: Family Medicine

## 2013-05-18 ENCOUNTER — Encounter: Payer: Self-pay | Admitting: Family Medicine

## 2013-05-18 VITALS — BP 124/82 | HR 70 | Wt 264.0 lb

## 2013-05-18 DIAGNOSIS — R748 Abnormal levels of other serum enzymes: Secondary | ICD-10-CM

## 2013-05-18 DIAGNOSIS — R635 Abnormal weight gain: Secondary | ICD-10-CM

## 2013-05-18 DIAGNOSIS — Z6835 Body mass index (BMI) 35.0-35.9, adult: Secondary | ICD-10-CM

## 2013-05-18 MED ORDER — PHENTERMINE HCL 37.5 MG PO CAPS: 37.5000 mg | ORAL_CAPSULE | ORAL | Status: DC

## 2013-05-18 NOTE — Progress Notes (Signed)
   Subjective:    Patient ID: Louis Phillips, male    DOB: 03/29/1980, 33 y.o.   MRN: [id number]  HPI F/U elevated liver enzymes. Says occasionally drinks alcohol but not daily. Last drink was 3 weeks.  Was on vicodine around the time he had labs draw as he hurt his back and [facility].  Occ takes fish oil but daily.  Has some discomfort in the RLQ, will get occ twitching or muscle spasm sensatoin.  Will comes and go.  Pain is mild and occ pulses. Has appt with GI next week.    He would like to discuss options for weight loss. He wants and if there's a prescription he can take. He's never done this before. That he would like to get back down to around 200 to 210 pounds. Is not currently getting any regular exercise but is physically active. He's not currently following any specific diet. He has tried a low carb diet in the past and has worked well for him.  Review of Systems     Objective:   Physical Exam  Constitutional: He is oriented to person, place, and time. He appears well-developed and well-nourished.  HENT:  Head: Normocephalic and atraumatic.  Cardiovascular: Normal rate, regular rhythm and normal heart sounds.   Pulmonary/Chest: Effort normal and breath sounds normal.  Neurological: He is alert and oriented to person, place, and time.  Skin: Skin is warm and dry.  Psychiatric: He has a normal mood and affect. His behavior is normal.          Assessment & Plan:  Elevated liver enzymes - Recheck liver enzymes. He is not taking anyt Tylenol products and alcohol. Recommend repeat in 2 weeks. He had been taking some Coral the time that enzymes were checked. Also recommend eating a low fat diet as fatty liver can also increase liver enzymes. Also go ahead and add an acute hepatitis panel. If liver enzymes are persistently elevated then we'll pursue ultrasound..   Abnoraml weight gain/BMI 35 - discussed optoins. Discussed phentermine as an option as long as his blood  pressures well controlled which it typically is. No prior history of cardiac problems. Discussed the risks and benefits of the medication. Stop immediately if any chest pain or palpitations. 20 to take it first thing in the morning as it can effect sleep if taken later on the day. Needs being conjunction with a diet and exercise program. Next the exercising for at least 30 minutes 5 days per week. Discussed some dietary changes to help increase vegetables and fruits and lower caloric intake. We'll need to follow up monthly for blood pressure we checked with the nurse. Patient agrees and understands.

## 2013-06-19 ENCOUNTER — Ambulatory Visit: Payer: BC Managed Care – PPO | Admitting: Family Medicine

## 2013-06-19 LAB — HEPATIC FUNCTION PANEL
ALT: 18 U/L (ref 0–53)
AST: 19 U/L (ref 0–37)
Albumin: 4.8 g/dL (ref 3.5–5.2)
Alkaline Phosphatase: 62 U/L (ref 39–117)
Bilirubin, Direct: 0.1 mg/dL (ref 0.0–0.3)
Indirect Bilirubin: 0.8 mg/dL (ref 0.2–1.2)
Total Bilirubin: 0.9 mg/dL (ref 0.2–1.2)
Total Protein: 7.7 g/dL (ref 6.0–8.3)

## 2013-06-19 LAB — HEPATITIS PANEL, ACUTE
HCV Ab: NEGATIVE
Hep A IgM: NONREACTIVE
Hep B C IgM: NONREACTIVE
Hepatitis B Surface Ag: NEGATIVE

## 2013-06-24 ENCOUNTER — Encounter: Payer: Self-pay | Admitting: Family Medicine

## 2013-06-24 ENCOUNTER — Ambulatory Visit (INDEPENDENT_AMBULATORY_CARE_PROVIDER_SITE_OTHER): Payer: BC Managed Care – PPO | Admitting: Family Medicine

## 2013-06-24 VITALS — BP 123/66 | HR 70 | Wt 267.0 lb

## 2013-06-24 DIAGNOSIS — R635 Abnormal weight gain: Secondary | ICD-10-CM

## 2013-06-24 MED ORDER — PHENTERMINE HCL 37.5 MG PO CAPS: 37.5000 mg | ORAL_CAPSULE | ORAL | Status: DC

## 2013-06-24 NOTE — Progress Notes (Signed)
   Subjective:    Patient ID: Luiz IronJames R Men, male    DOB: 05/24/1980, 33 y.o.   MRN: [id number]  HPI Abnormal weight gain-he did feel the phentermine but unfortunately was on back order from us 2 and half weeks that he just started it a little under 2 weeks ago. He is actually gained a couple pounds since then. It's difficult to say what his baseline was before he started the medication. Otherwise it is tolerating it well without any chest pain, palpitations or shortness of breath.   Review of Systems     Objective:   Physical Exam        Assessment & Plan:  Abnormal weight gain-blood pressures well controlled on current regimen. I will go ahead and refill his medication today and he'll need to followup in 5-6 weeks. If he has not had significant weight loss of at least 3 pounds or more we may need to look at alternatives for treatment. Nani Gasseratherine Metheney, MD

## 2013-06-29 ENCOUNTER — Other Ambulatory Visit: Payer: Self-pay | Admitting: Family Medicine

## 2013-07-29 ENCOUNTER — Ambulatory Visit (INDEPENDENT_AMBULATORY_CARE_PROVIDER_SITE_OTHER): Payer: BC Managed Care – PPO | Admitting: Physician Assistant

## 2013-07-29 ENCOUNTER — Encounter: Payer: Self-pay | Admitting: Physician Assistant

## 2013-07-29 VITALS — BP 128/86 | HR 84 | Temp 98.7°F | Wt 266.0 lb

## 2013-07-29 DIAGNOSIS — E669 Obesity, unspecified: Secondary | ICD-10-CM | POA: Insufficient documentation

## 2013-07-29 NOTE — Progress Notes (Signed)
Pt came in today for a weight check. Pt last weight was 257 lbs. He has gained 9 lbs. Pt calms he was in a work related accident and was told it would be best if he stopped taking the phentermine sine he was not going to be able to do any exercises./Koltin Wehmeyer,CMA  Agree with plan since pt has not been able to lose on medication. We will discontinue. Follow up wit PCP for other weight loss ideas.  Tandy GawJade Breeback PA-C

## 2013-08-11 ENCOUNTER — Ambulatory Visit: Payer: BC Managed Care – PPO | Admitting: Family Medicine

## 2013-08-12 ENCOUNTER — Telehealth: Payer: Self-pay | Admitting: Family Medicine

## 2013-08-12 NOTE — Telephone Encounter (Signed)
Please call patient: I did review his copy of his MRI that he had done through work recently, I believe after a work injury. It was noted that he had a hyperintense lesion on the right kidney that was most likely a cyst. But they had recommended ultrasound for further evaluation. I think this is very reasonable. If he would like to move forward with the ultrasound, this is no radiation, I will be happy to place the order and this can actually be done downstairs at [facility] location.

## 2013-08-13 ENCOUNTER — Encounter: Payer: Self-pay | Admitting: Family Medicine

## 2013-08-13 ENCOUNTER — Ambulatory Visit (INDEPENDENT_AMBULATORY_CARE_PROVIDER_SITE_OTHER): Payer: BC Managed Care – PPO | Admitting: Family Medicine

## 2013-08-13 VITALS — BP 136/79 | HR 96 | Temp 98.4°F | Ht 72.0 in | Wt 271.0 lb

## 2013-08-13 DIAGNOSIS — R9389 Abnormal findings on diagnostic imaging of other specified body structures: Secondary | ICD-10-CM

## 2013-08-13 DIAGNOSIS — M549 Dorsalgia, unspecified: Secondary | ICD-10-CM

## 2013-08-13 DIAGNOSIS — Z23 Encounter for immunization: Secondary | ICD-10-CM

## 2013-08-13 DIAGNOSIS — N289 Disorder of kidney and ureter, unspecified: Secondary | ICD-10-CM

## 2013-08-13 DIAGNOSIS — I10 Essential (primary) hypertension: Secondary | ICD-10-CM

## 2013-08-13 DIAGNOSIS — M5489 Other dorsalgia: Secondary | ICD-10-CM

## 2013-08-13 LAB — POCT URINALYSIS DIPSTICK
Bilirubin, UA: NEGATIVE
Blood, UA: NEGATIVE
Glucose, UA: NEGATIVE
Ketones, UA: NEGATIVE
Leukocytes, UA: NEGATIVE
Nitrite, UA: NEGATIVE
Protein, UA: NEGATIVE
Spec Grav, UA: 1.025
Urobilinogen, UA: 0.2
pH, UA: 6.5

## 2013-08-13 MED ORDER — PHENTERMINE HCL 37.5 MG PO CAPS: 37.5000 mg | ORAL_CAPSULE | ORAL | Status: DC

## 2013-08-13 NOTE — Progress Notes (Signed)
   Subjective:    Patient ID: Louis Phillips, male    DOB: 01/16/1980, 33 y.o.   MRN: [id number]  HPI Patient had an MRI performed of his spine. It was noted that he had a hyperintense lesion on his inferior pole of his right kidney. Ms. likely consistent with a cyst but they had recommended ultrasound for further evaluation.  Hypertension- Pt denies chest pain, SOB, dizziness, or heart palpitations.  Taking meds as directed w/o problems.  Denies medication side effects.  He is off phentermine bc not able to work out.    He is on muscle relaxer, NSAID and vicodin for his back pain.    Review of Systems     Objective:   Physical Exam  Constitutional: He is oriented to person, place, and time. He appears well-nourished.  HENT:  Head: Normocephalic and atraumatic.  Neurological: He is alert and oriented to person, place, and time.  Skin: Skin is warm and dry.  Psychiatric: He has a normal mood and affect. His behavior is normal.          Assessment & Plan:  F/U MRI report - discussed the need ultrasound for further evaluation. Discussed that likely this is an often times these are benign simple cysts. He's okay to schedule this.  Hypertension-well-controlled today but is increasing as he is gaining weight. We discussed the option of putting him back on the phentermine at least temporarily since he is not able to exercise to help maintain his weight as he is on such a great job in losing any he's been slowly gaining it back since his back injury. Hopefully he will be able to recover in the next month or 2 and can get back into regular exercise routine. We discussed some strategies to help get his heart rate up with exercise without injuring his back.  Flu vaccine given today.

## 2013-08-13 NOTE — Addendum Note (Signed)
Addended by: Deno EtienneBARKLEY, Vinia Jemmott L on: 08/13/2013 01:21 PM   Modules accepted: Orders

## 2013-08-13 NOTE — Telephone Encounter (Signed)
Pt informed at OV .Dianne Whelchel Lynetta  

## 2013-08-19 ENCOUNTER — Ambulatory Visit (INDEPENDENT_AMBULATORY_CARE_PROVIDER_SITE_OTHER): Payer: BC Managed Care – PPO

## 2013-08-19 DIAGNOSIS — N289 Disorder of kidney and ureter, unspecified: Secondary | ICD-10-CM

## 2013-08-19 DIAGNOSIS — R9389 Abnormal findings on diagnostic imaging of other specified body structures: Secondary | ICD-10-CM

## 2013-10-01 ENCOUNTER — Other Ambulatory Visit: Payer: Self-pay | Admitting: Physician Assistant

## 2013-10-23 ENCOUNTER — Ambulatory Visit: Payer: BC Managed Care – PPO | Admitting: Family Medicine

## 2014-01-12 ENCOUNTER — Other Ambulatory Visit: Payer: Self-pay | Admitting: Physician Assistant

## 2014-02-16 ENCOUNTER — Other Ambulatory Visit: Payer: Self-pay | Admitting: Family Medicine

## 2014-03-12 ENCOUNTER — Ambulatory Visit (INDEPENDENT_AMBULATORY_CARE_PROVIDER_SITE_OTHER): Payer: BLUE CROSS/BLUE SHIELD | Admitting: Family Medicine

## 2014-03-12 ENCOUNTER — Encounter: Payer: Self-pay | Admitting: Family Medicine

## 2014-03-12 VITALS — BP 142/100 | HR 85 | Wt 287.0 lb

## 2014-03-12 DIAGNOSIS — R748 Abnormal levels of other serum enzymes: Secondary | ICD-10-CM

## 2014-03-12 DIAGNOSIS — I10 Essential (primary) hypertension: Secondary | ICD-10-CM

## 2014-03-12 DIAGNOSIS — N281 Cyst of kidney, acquired: Secondary | ICD-10-CM

## 2014-03-12 DIAGNOSIS — Z6838 Body mass index (BMI) 38.0-38.9, adult: Secondary | ICD-10-CM

## 2014-03-12 DIAGNOSIS — Q6100 Congenital renal cyst, unspecified: Secondary | ICD-10-CM

## 2014-03-12 MED ORDER — ENALAPRIL MALEATE 10 MG PO TABS
10.0000 mg | ORAL_TABLET | Freq: Every day | ORAL | Status: DC
Start: 2014-03-12 — End: 2014-03-29

## 2014-03-12 NOTE — Patient Instructions (Signed)
[employer]

## 2014-03-12 NOTE — Progress Notes (Signed)
Subjective:    Patient ID: Louis Phillips, male    DOB: 1980/10/26, 34 y.o.   MRN: [id number]  HPI Hypertension- Pt denies chest pain, SOB, dizziness, or heart palpitations.  Taking meds as directed w/o problems.  Denies medication side effects.    Elevated liver enzymes-he had a bump in his liver enzymes back in May. We had rechecked imaging and they had come back down. He is due to recheck these. He was negative for acute hepatitis.  He said he recently has gone back on track with diet and exercise. He's really cut out sugar and reduce his cars. He said he is Re: Lost a few pounds but knows that he significantly overweight. He would like to get back down to about 200 pounds. He said when he was going to the gym 5 days a week he was able to maintain his weight between 180 and 200 pounds.  Review of Systems  BP 142/100 mmHg  Pulse 85  Wt 287 lb (130.182 kg)  SpO2 95%    No Known Allergies  Past Medical History  Diagnosis Date  . Anxiety   . Hypertension     Past Surgical History  Procedure Laterality Date  . Tonsillectomy  34 yrs old  . Back surgery      History   Social History  . Marital Status: Married    Spouse Name: N/A  . Number of Children: 1  . Years of Education: N/A   Occupational History  .  [employer]   Social History Main Topics  . Smoking status: Never Smoker   . Smokeless tobacco: Never Used  . Alcohol Use: Yes     Comment: socially  . Drug Use: No  . Sexual Activity: Not on file   Other Topics Concern  . Not on file   Social History Narrative    Family History  Problem Relation Age of Onset  . Diabetes Father   . Hypertension Father   . Hyperlipidemia Father   . Other Maternal Grandmother 55    AMI  . Heart disease Maternal Grandmother 55    AMI  . Diabetes Mother   . Hyperlipidemia Mother   . Hypertension Mother   . Colon cancer Father 71  . Rheum arthritis Father     Outpatient Encounter Prescriptions as of 03/12/2014   Medication Sig  . enalapril (VASOTEC) 10 MG tablet Take 1 tablet (10 mg total) by mouth daily.  . [DISCONTINUED] enalapril (VASOTEC) 5 MG tablet TAKE 1 TABLET (5 MG TOTAL) BY MOUTH DAILY - OFFICE APPOINTMENT NEEDED FOR REFILLS  . [DISCONTINUED] cyclobenzaprine (FLEXERIL) 10 MG tablet   . [DISCONTINUED] diclofenac (VOLTAREN) 75 MG EC tablet   . [DISCONTINUED] HYDROcodone-acetaminophen (NORCO/VICODIN) 5-325 MG per tablet   . [DISCONTINUED] phentermine 37.5 MG capsule Take 1 capsule (37.5 mg total) by mouth every morning.          Objective:   Physical Exam  Constitutional: He is oriented to person, place, and time. He appears well-developed and well-nourished.  HENT:  Head: Normocephalic and atraumatic.  Cardiovascular: Normal rate, regular rhythm and normal heart sounds.   Pulmonary/Chest: Effort normal and breath sounds normal.  Neurological: He is alert and oriented to person, place, and time.  Skin: Skin is warm and dry.  Psychiatric: He has a normal mood and affect. His behavior is normal.          Assessment & Plan:  HTN - will restart medication.  F/U in 2-3  weeks to recheck BP to make sure at goal. Due for CMP. Also check TSH. Screen for diabetes.  Elevated liver enzymes-due for repeat check.  2.2 cm on right kidney due for repeat US. Will schedule.   BMI 38- discussed the importance of weight loss. He says he might check into a [facility] clinic [facility] to see what they offer. I recommended the smart phone application called my fitness pal. I strongly encouraged him to try for at least a month. I think if he can set some goals for himself and really stick to it as well as getting back to the gym think would make a huge difference.

## 2014-03-13 LAB — COMPLETE METABOLIC PANEL WITH GFR
ALT: 26 U/L (ref 0–53)
AST: 22 U/L (ref 0–37)
Albumin: 4.7 g/dL (ref 3.5–5.2)
Alkaline Phosphatase: 63 U/L (ref 39–117)
BUN: 17 mg/dL (ref 6–23)
CO2: 26 mEq/L (ref 19–32)
Calcium: 9.5 mg/dL (ref 8.4–10.5)
Chloride: 103 mEq/L (ref 96–112)
Creat: 1.08 mg/dL (ref 0.50–1.35)
GFR, Est African American: >89 mL/min
GFR, Est Non African American: >89 mL/min
Glucose, Bld: 85 mg/dL (ref 70–99)
Potassium: 4 mEq/L (ref 3.5–5.3)
Sodium: 139 mEq/L (ref 135–145)
Total Bilirubin: 0.7 mg/dL (ref 0.2–1.2)
Total Protein: 7.3 g/dL (ref 6.0–8.3)

## 2014-03-13 LAB — TSH: TSH: 1.468 u[IU]/mL (ref 0.350–4.500)

## 2014-03-13 LAB — HEMOGLOBIN A1C
Hgb A1c MFr Bld: 5 % (ref <5.7)
Mean Plasma Glucose: 97 mg/dL (ref <117)

## 2014-03-18 ENCOUNTER — Ambulatory Visit (INDEPENDENT_AMBULATORY_CARE_PROVIDER_SITE_OTHER): Payer: BLUE CROSS/BLUE SHIELD

## 2014-03-18 DIAGNOSIS — N281 Cyst of kidney, acquired: Secondary | ICD-10-CM

## 2014-03-19 ENCOUNTER — Telehealth: Payer: Self-pay | Admitting: *Deleted

## 2014-03-19 NOTE — Telephone Encounter (Signed)
Called patient with lab results. He was very pleased..Louis Phillips

## 2014-03-19 NOTE — Telephone Encounter (Signed)
-----   Message from Agapito Gamesatherine D Metheney, MD sent at 03/19/2014  7:57 AM EDT ----- Call pt: Louis Phillips shows cyst is now smaller so this is benign. No further imaging or work-up needed.

## 2014-03-29 ENCOUNTER — Ambulatory Visit (INDEPENDENT_AMBULATORY_CARE_PROVIDER_SITE_OTHER): Payer: BLUE CROSS/BLUE SHIELD | Admitting: Family Medicine

## 2014-03-29 VITALS — BP 147/91 | HR 77 | Ht 72.0 in | Wt 289.0 lb

## 2014-03-29 DIAGNOSIS — I10 Essential (primary) hypertension: Secondary | ICD-10-CM | POA: Diagnosis not present

## 2014-03-29 MED ORDER — ENALAPRIL MALEATE 20 MG PO TABS: 20.0000 mg | ORAL_TABLET | Freq: Every day | ORAL | Status: DC

## 2014-03-29 NOTE — Progress Notes (Signed)
   Subjective:    Patient ID: Louis Phillips, male    DOB: 04/17/1980, 34 y.o.   MRN: [id number]  HPI Patient is here for blood pressure check. Denies any trouble sleeping, palpitations, shortness of breath, blurred vision, dizziness, or any other medication problems.    Review of Systems     Objective:   Physical Exam        Assessment & Plan:  Patient's blood pressure was 147/91. Advised Pt we will contact him if there are going to be any changes in his medication. Verbalized understanding. No further questions.

## 2014-03-29 NOTE — Progress Notes (Signed)
   Subjective:    Patient ID: Louis IronJames R Garde, male    DOB: 10/25/1980, 34 y.o.   MRN: [id number] Pt notified to increase enalipril to 20mg  and to f/u in 1 month. Donne AnonAmber Nicolette Gieske, CMA HPI    Review of Systems     Objective:   Physical Exam        Assessment & Plan:

## 2014-03-29 NOTE — Progress Notes (Signed)
   Subjective:    Patient ID: Luiz IronJames R Louis, male    DOB: 03/25/1980, 34 y.o.   MRN: [id number]  HPI    Review of Systems     Objective:   Physical Exam        Assessment & Plan:  HTN - Uncontrolled. Will increase the enalapril to 20mg .  New script sent. Can take 2 of what he has until pick up new rx.  F/U in 1 month with me.   Nani Gasseratherine Metheney, MD

## 2014-05-11 ENCOUNTER — Emergency Department (INDEPENDENT_AMBULATORY_CARE_PROVIDER_SITE_OTHER)
Admission: EM | Admit: 2014-05-11 | Discharge: 2014-05-11 | Disposition: A | Discharge status: Home / Self Care | Payer: BLUE CROSS/BLUE SHIELD | Source: Home / Self Care | Attending: Emergency Medicine | Admitting: Emergency Medicine

## 2014-05-11 ENCOUNTER — Encounter: Payer: Self-pay | Admitting: *Deleted

## 2014-05-11 DIAGNOSIS — J209 Acute bronchitis, unspecified: Secondary | ICD-10-CM

## 2014-05-11 MED ORDER — PREDNISONE 10 MG (21) PO TBPK: ORAL_TABLET | ORAL | Status: DC

## 2014-05-11 MED ORDER — PROMETHAZINE-CODEINE 6.25-10 MG/5ML PO SYRP: ORAL_SOLUTION | ORAL | Status: DC

## 2014-05-11 MED ORDER — AZITHROMYCIN 250 MG PO TABS: ORAL_TABLET | ORAL | Status: DC

## 2014-05-11 NOTE — ED Notes (Signed)
Pt c/o nonproductive cough, worse at night x 5 days. Denies fever. He also c/o some LT arm pain.

## 2014-05-11 NOTE — ED Provider Notes (Signed)
CSN: [id number]     Arrival date & time 05/11/14  1626 History   First MD Initiated Contact with Patient 05/11/14 1636     Chief Complaint  Patient presents with  . Cough   (Consider location/radiation/quality/duration/timing/severity/associated sxs/prior Treatment) HPI 5 days of progressively worsening cough, occasionally productive. Worse at night. Occasionally has wheezing. No history of asthma but is a family history of asthma. Cough keeps him up at night. Medical had a low-grade fever. No shortness of breath. Past Medical History  Diagnosis Date  . Anxiety   . Hypertension    Past Surgical History  Procedure Laterality Date  . Tonsillectomy  34 yrs old  . Back surgery     Family History  Problem Relation Age of Onset  . Diabetes Father   . Hypertension Father   . Hyperlipidemia Father   . Other Maternal Grandmother 55    AMI  . Heart disease Maternal Grandmother 55    AMI  . Diabetes Mother   . Hyperlipidemia Mother   . Hypertension Mother   . Colon cancer Father [id number]  . Rheum arthritis Father    History  Substance Use Topics  . Smoking status: Never Smoker   . Smokeless tobacco: Never Used  . Alcohol Use: Yes     Comment: socially    Review of Systems Remainder of Review of Systems negative for acute change except as noted in the HPI.  Allergies  Review of patient's allergies indicates no known allergies.  Home Medications   Prior to Admission medications   Medication Sig Start Date End Date Taking? Authorizing Provider  azithromycin (ZITHROMAX Z-PAK) 250 MG tablet Take 2 tablets on day one, then 1 tablet daily on days 2 through 5 05/11/14   Lajean Manesavid Massey, MD  enalapril (VASOTEC) 20 MG tablet Take 1 tablet (20 mg total) by mouth daily. 03/29/14   Agapito Gamesatherine D Metheney, MD  predniSONE (STERAPRED UNI-PAK 21 TAB) 10 MG (21) TBPK tablet Take as directed for 6 days.--Take 6 on day 1, 5 on day 2, 4 on day 3, then 3 tablets on day 4, then 2 tablets on day 5, then 1  on day 6. 05/11/14   Lajean Manesavid Massey, MD  promethazine-codeine [facility](PHENERGAN WITH CODEINE) 6.25-10 MG/5ML syrup Take 1-2 teaspoons at that time as needed for cough.  May cause drowsiness. 05/11/14   Lajean Manesavid Massey, MD   BP 127/87 mmHg  Pulse 88  Temp(Src) 98.6 F (37 C) (Oral)  Resp 18  Ht 6' (1.829 m)  Wt 289 lb (131.09 kg)  BMI 39.19 kg/m2  SpO2 97% Physical Exam  Constitutional: He is oriented to person, place, and time. He appears well-developed and well-nourished. No distress.  HENT:  Head: Normocephalic and atraumatic.  Right Ear: Tympanic membrane normal.  Left Ear: Tympanic membrane normal.  Nose: Nose normal.  Mouth/Throat: Oropharynx is clear and moist. No oropharyngeal exudate.  Eyes: Right eye exhibits no discharge. Left eye exhibits no discharge. No scleral icterus.  Neck: Neck supple.  Cardiovascular: Normal rate, regular rhythm and normal heart sounds.   Pulmonary/Chest: No respiratory distress. He has wheezes (Mild late expiratory). He has rhonchi. He has no rales.  Lymphadenopathy:    He has no cervical adenopathy.  Neurological: He is alert and oriented to person, place, and time.  Skin: Skin is warm and dry.  Nursing note and vitals reviewed.  Pulse ox 97% room air ED Course  Procedures (including critical care time) Labs Review Labs Reviewed - No data to  display  Imaging Review No results found.   MDM   1. Bronchitis, acute, with bronchospasm    Treatment options discussed, as well as risks, benefits, alternatives. Patient voiced understanding and agreement with the following plans:   Discharge Medication List as of 05/11/2014  5:53 PM    START taking these medications   Details  azithromycin (ZITHROMAX Z-PAK) 250 MG tablet Take 2 tablets on day one, then 1 tablet daily on days 2 through 5, Normal    predniSONE (STERAPRED UNI-PAK 21 TAB) 10 MG (21) TBPK tablet Take as directed for 6 days.--Take 6 on day 1, 5 on day 2, 4 on day 3, then 3 tablets on day 4,  then 2 tablets on day 5, then 1 on day 6., Normal    promethazine-codeine (PHENERGAN WITH CODEINE) 6.25-10 MG/5ML syrup Take 1-2 teaspoons at that time as needed for cough.  May cause drowsiness., Print       1 or 2 teaspoons at bedtime as needed for cough. Follow-up with your primary care doctor in 5-7 days if not improving, or sooner if symptoms become worse. Precautions discussed. Red flags discussed. Questions invited and answered. Patient voiced understanding and agreement.     Lajean Manesavid Massey, MD 05/11/14 [id number]

## 2014-06-03 ENCOUNTER — Other Ambulatory Visit: Payer: Self-pay | Admitting: Family Medicine

## 2014-07-28 ENCOUNTER — Other Ambulatory Visit: Payer: Self-pay | Admitting: Family Medicine

## 2014-08-14 ENCOUNTER — Emergency Department
Admission: EM | Admit: 2014-08-14 | Discharge: 2014-08-14 | Disposition: A | Discharge status: Home / Self Care | Payer: 59 | Source: Home / Self Care | Attending: Family Medicine | Admitting: Family Medicine

## 2014-08-14 ENCOUNTER — Encounter: Payer: Self-pay | Admitting: *Deleted

## 2014-08-14 DIAGNOSIS — J069 Acute upper respiratory infection, unspecified: Secondary | ICD-10-CM | POA: Diagnosis not present

## 2014-08-14 MED ORDER — OXYMETAZOLINE HCL 0.05 % NA SOLN: 1.0000 | Freq: Two times a day (BID) | NASAL | Status: DC | PRN

## 2014-08-14 NOTE — ED Provider Notes (Signed)
CSN: [id number]     Arrival date & time 08/14/14  1036 History   First MD Initiated Contact with Patient 08/14/14 1052     Chief Complaint  Patient presents with  . Generalized Body Aches  . Sinus Problem   (Consider location/radiation/quality/duration/timing/severity/associated sxs/prior Treatment) HPI The pt is a 34yo male presenting to [facility] with c/o flu-like symptoms with associated nasal congestion with post-nasal drip, bilateral ear pain and pressure, body aches and chills.  Pt states he feels like he has a lot of sinus pressure but mucous is clear.  He has tried sudafed and nasal saline with minimal relief.  Stats his symptoms are moderate in severity, gradually worsening. Reports his son was recently sick but his symptoms resolved on their own within a few days. Pt denies cough, chest pain or SOB. Denies hx of asthma. Denies recent travel or known tick bites. Denies rashes.  Past Medical History  Diagnosis Date  . Anxiety   . Hypertension    Past Surgical History  Procedure Laterality Date  . Tonsillectomy  34 yrs old  . Back surgery     Family History  Problem Relation Age of Onset  . Diabetes Father   . Hypertension Father   . Hyperlipidemia Father   . Other Maternal Grandmother 55    AMI  . Heart disease Maternal Grandmother 55    AMI  . Diabetes Mother   . Hyperlipidemia Mother   . Hypertension Mother   . Colon cancer Father 15  . Rheum arthritis Father    Social History  Substance Use Topics  . Smoking status: Never Smoker   . Smokeless tobacco: Never Used  . Alcohol Use: Yes     Comment: socially    Review of Systems  Constitutional: Positive for fever and chills.  HENT: Positive for congestion, ear pain, postnasal drip, rhinorrhea, sinus pressure and sore throat. Negative for trouble swallowing and voice change.   Respiratory: Negative for cough and shortness of breath.   Musculoskeletal: Positive for myalgias and arthralgias.  Neurological: Positive for  headaches. Negative for dizziness and light-headedness.    Allergies  Review of patient's allergies indicates no known allergies.  Home Medications   Prior to Admission medications   Medication Sig Start Date End Date Taking? Authorizing Provider  enalapril (VASOTEC) 20 MG tablet TAKE 1 TABLET (20 MG TOTAL) BY MOUTH DAILY. 07/28/14   Agapito Games, MD  oxymetazoline (AFRIN NASAL SPRAY) 0.05 % nasal spray Place 1 spray into both nostrils 2 (two) times daily as needed for congestion. Do no exceed 3 consecutive days 08/14/14   Junius Finner, PA-C   BP 131/86 mmHg  Pulse 95  Temp(Src) 98.8 F (37.1 C) (Oral)  Resp 16  Wt 298 lb (135.172 kg)  SpO2 97% Physical Exam  Constitutional: He appears well-developed and well-nourished.  HENT:  Head: Normocephalic and atraumatic.  Right Ear: Hearing, tympanic membrane, external ear and ear canal normal.  Left Ear: Hearing, tympanic membrane, external ear and ear canal normal.  Nose: Mucosal edema present. Right sinus exhibits no maxillary sinus tenderness and no frontal sinus tenderness. Left sinus exhibits no maxillary sinus tenderness and no frontal sinus tenderness.  Mouth/Throat: Uvula is midline, oropharynx is clear and moist and mucous membranes are normal.  Eyes: Conjunctivae are normal. No scleral icterus.  Neck: Normal range of motion. Neck supple.  Cardiovascular: Normal rate, regular rhythm and normal heart sounds.   Pulmonary/Chest: Effort normal and breath sounds normal. No respiratory distress. He  has no wheezes. He has no rales. He exhibits no tenderness.  Abdominal: Soft. Bowel sounds are normal. He exhibits no distension and no mass. There is no tenderness. There is no rebound and no guarding.  Musculoskeletal: Normal range of motion.  Lymphadenopathy:    He has no cervical adenopathy.  Neurological: He is alert.  Skin: Skin is warm and dry.  Nursing note and vitals reviewed.   ED Course  Procedures (including  critical care time) Labs Review Labs Reviewed - No data to display  Imaging Review No results found.   MDM   1. Acute upper respiratory infection    Pt c/o sinus congestion with facial and ear pressure x2 days. Pt appears well, non-toxic. Afebrile. TMs: normal. No evidence of strep throat. Lungs: CTAB. Pt does have nasal mucosa edema.  Due to sudden onset and short duration (2 days) of symptoms, likely viral URI. Will tx symptomatically. Rx: afrin. Advised to continue sudafed and saline rinses. May also use acetaminophen and ibuprofen for fever and pain. Encouraged fluids and rest. F/u with PCP in 1 week if not improving, sooner if worsening. Patient verbalized understanding and agreement with treatment plan.    Junius Finner, PA-C 08/14/14 1237

## 2014-08-14 NOTE — ED Notes (Signed)
Pt c/o 2 days of sinus congestion and drainage, body aches, ear pain. Afebrile.

## 2014-08-14 NOTE — Discharge Instructions (Signed)

## 2014-08-21 ENCOUNTER — Other Ambulatory Visit: Payer: Self-pay | Admitting: Family Medicine

## 2014-08-23 NOTE — Telephone Encounter (Signed)
Left voicemail with Pt informing a follow up appt with PCP was needed, callback information provided.

## 2014-08-27 ENCOUNTER — Ambulatory Visit (INDEPENDENT_AMBULATORY_CARE_PROVIDER_SITE_OTHER): Payer: 59 | Admitting: Family Medicine

## 2014-08-27 ENCOUNTER — Other Ambulatory Visit: Payer: Self-pay | Admitting: Family Medicine

## 2014-08-27 ENCOUNTER — Ambulatory Visit (HOSPITAL_BASED_OUTPATIENT_CLINIC_OR_DEPARTMENT_OTHER)
Admission: RE | Admit: 2014-08-27 | Discharge: 2014-08-27 | Disposition: A | Discharge status: Home / Self Care | Payer: 59 | Source: Ambulatory Visit | Attending: Family Medicine | Admitting: Family Medicine

## 2014-08-27 ENCOUNTER — Encounter: Payer: Self-pay | Admitting: Family Medicine

## 2014-08-27 VITALS — BP 134/75 | HR 103 | Temp 98.5°F | Wt 298.0 lb

## 2014-08-27 DIAGNOSIS — N508 Other specified disorders of male genital organs: Secondary | ICD-10-CM

## 2014-08-27 DIAGNOSIS — I10 Essential (primary) hypertension: Secondary | ICD-10-CM | POA: Diagnosis not present

## 2014-08-27 DIAGNOSIS — N5089 Other specified disorders of the male genital organs: Secondary | ICD-10-CM

## 2014-08-27 DIAGNOSIS — N50811 Right testicular pain: Secondary | ICD-10-CM

## 2014-08-27 DIAGNOSIS — Z23 Encounter for immunization: Secondary | ICD-10-CM | POA: Diagnosis not present

## 2014-08-27 MED ORDER — LEVOFLOXACIN 500 MG PO TABS: 500.0000 mg | ORAL_TABLET | Freq: Every day | ORAL | Status: AC

## 2014-08-27 MED ORDER — ENALAPRIL MALEATE 20 MG PO TABS: ORAL_TABLET | ORAL | Status: DC

## 2014-08-27 NOTE — Progress Notes (Signed)
   Subjective:    Patient ID: Louis Phillips, male    DOB: 04-02-1980, 34 y.o.   MRN: [id number]  HPI Hypertension- Pt denies chest pain, SOB, dizziness, or heart palpitations.  Taking meds as directed w/o problems.  Denies medication side effects.    Right testicle bc swollen an tender after a lifting heavy things yesterday.  No prior hernias. No hx of undescended testicles. Whole are is swollen.  Has been icing it and helps some.  No trauma.  Says pain radiates up into the abdomen.  Never happened before.    Review of Systems     Objective:   Physical Exam  Constitutional: He is oriented to person, place, and time. He appears well-developed and well-nourished.  HENT:  Head: Normocephalic and atraumatic.  Cardiovascular: Normal rate, regular rhythm and normal heart sounds.   Pulmonary/Chest: Effort normal and breath sounds normal.  Neurological: He is alert and oriented to person, place, and time.  Skin: Skin is warm and dry.  Psychiatric: He has a normal mood and affect. His behavior is normal.   Right testicle is diffusely swollen. Tender near the top. Nontender over the epididymis. No significant fluid within the scrotal sac. Negative for hernia on the right or left.       Assessment & Plan:  HTN - well controlled. F/U in 6 motns.  Due for BMP. Can go anytime.   Right testicular pain and swelling - will send to [facility] for evaluation.  Evaluate for acute blood flow issue such as torsion etc. Evaluate for infection or abscess. There was no sign of redness or cellulitis or rash on the testicle. Continue to ice as needed for pain relief. Skin certainly use Tylenol or ibuprofen if needed.

## 2014-08-30 ENCOUNTER — Telehealth: Payer: Self-pay | Admitting: Family Medicine

## 2014-08-30 DIAGNOSIS — N5089 Other specified disorders of the male genital organs: Secondary | ICD-10-CM

## 2014-08-30 NOTE — Telephone Encounter (Signed)
Pt called after hour call center:  Reported temp of 101.3, called to check on Pt. States the fever broke Saturday night after taking tylenol as directed by on call Physician. Pt states the edema in his testicle is "better" but still swollen, Pt is still taking antibiotics. Will route to PCP for review if Pt needs a f/u appt. Pt advised I would contact him to schedule if needed. Verbalized understanding.

## 2014-08-30 NOTE — Telephone Encounter (Signed)
Lab ordered, Pt notified. No further questions at this time.

## 2014-08-30 NOTE — Telephone Encounter (Signed)
Let have him go for CBC today. Continue to monitor for fever.

## 2014-08-31 LAB — CBC WITH DIFFERENTIAL/PLATELET
Basophils Absolute: 0.1 10*3/uL (ref 0.0–0.1)
Basophils Relative: 1 % (ref 0–1)
Eosinophils Absolute: 0.2 10*3/uL (ref 0.0–0.7)
Eosinophils Relative: 3 % (ref 0–5)
HCT: 40 % (ref 39.0–52.0)
Hemoglobin: 13.7 g/dL (ref 13.0–17.0)
Lymphocytes Relative: 35 % (ref 12–46)
Lymphs Abs: 2.2 10*3/uL (ref 0.7–4.0)
MCH: 31.7 pg (ref 26.0–34.0)
MCHC: 34.3 g/dL (ref 30.0–36.0)
MCV: 92.6 fL (ref 78.0–100.0)
MPV: 9.3 fL (ref 8.6–12.4)
Monocytes Absolute: 0.5 10*3/uL (ref 0.1–1.0)
Monocytes Relative: 8 % (ref 3–12)
Neutro Abs: 3.4 10*3/uL (ref 1.7–7.7)
Neutrophils Relative %: 53 % (ref 43–77)
Platelets: 271 10*3/uL (ref 150–400)
RBC: 4.32 MIL/uL (ref 4.22–5.81)
RDW: 12.6 % (ref 11.5–15.5)
WBC: 6.4 10*3/uL (ref 4.0–10.5)

## 2014-09-02 ENCOUNTER — Ambulatory Visit (INDEPENDENT_AMBULATORY_CARE_PROVIDER_SITE_OTHER): Payer: 59 | Admitting: Family Medicine

## 2014-09-02 ENCOUNTER — Encounter: Payer: Self-pay | Admitting: Family Medicine

## 2014-09-02 VITALS — BP 123/73 | HR 76 | Temp 98.0°F | Ht 72.0 in | Wt 291.0 lb

## 2014-09-02 DIAGNOSIS — N453 Epididymo-orchitis: Secondary | ICD-10-CM | POA: Diagnosis not present

## 2014-09-02 NOTE — Progress Notes (Signed)
   Subjective:    Patient ID: Louis Phillips, male    DOB: [birth year], 34 y.o.   MRN: [id number]  HPI Follow-up epididymoorchitis Says is is more than 50% better.  No more fever since the weekend.  Has taken about 7 days of the Levaquin.  Says has some nausea and dec appetite the first few days.  Wife is here with him today.    Review of Systems     Objective:   Physical Exam  Constitutional: He is oriented to person, place, and time. He appears well-developed and well-nourished.  HENT:  Head: Normocephalic and atraumatic.  Genitourinary:  Right scrotum and testicle still with some mild swelling. No erythema.  Still tender over teh epididymis.    Neurological: He is alert and oriented to person, place, and time.  Skin: Skin is warm and dry.  Psychiatric: He has a normal mood and affect. His behavior is normal.          Assessment & Plan:  Follow-up epididymoorchitis. He's had greater than 50% reduction in symptoms and is completely afebrile now. He still has some swelling and tenderness on exam. Make sure complete full course. Because he is under 35 a would like to test him for gonorrhea and chlamydia about a week after he finishes the antibiotic and did encourage his wife who is here with him today to get tested as well. Explained that sometimes it's from other urologic sources as well.

## 2014-09-06 ENCOUNTER — Other Ambulatory Visit: Payer: Self-pay | Admitting: Family Medicine

## 2014-09-08 ENCOUNTER — Other Ambulatory Visit: Payer: Self-pay | Admitting: Family Medicine

## 2014-10-01 LAB — GC/CHLAMYDIA PROBE AMP, URINE
Chlamydia, Swab/Urine, PCR: NEGATIVE
GC Probe Amp, Urine: NEGATIVE

## 2014-10-08 ENCOUNTER — Other Ambulatory Visit: Payer: Self-pay

## 2014-10-08 MED ORDER — ENALAPRIL MALEATE 20 MG PO TABS: 20.0000 mg | ORAL_TABLET | Freq: Every day | ORAL | Status: DC

## 2014-10-16 ENCOUNTER — Other Ambulatory Visit: Payer: Self-pay | Admitting: Family Medicine

## 2014-12-03 ENCOUNTER — Encounter: Payer: Self-pay | Admitting: Family Medicine

## 2014-12-03 ENCOUNTER — Ambulatory Visit (INDEPENDENT_AMBULATORY_CARE_PROVIDER_SITE_OTHER): Payer: 59 | Admitting: Family Medicine

## 2014-12-03 VITALS — BP 138/89 | HR 88 | Temp 98.4°F | Resp 18 | Wt 298.4 lb

## 2014-12-03 DIAGNOSIS — R0683 Snoring: Secondary | ICD-10-CM | POA: Diagnosis not present

## 2014-12-03 DIAGNOSIS — R0789 Other chest pain: Secondary | ICD-10-CM

## 2014-12-03 DIAGNOSIS — R000 Tachycardia, unspecified: Secondary | ICD-10-CM

## 2014-12-03 NOTE — Progress Notes (Signed)
Subjective:    Patient ID: Louis Phillips, male    DOB: 05/30/1980, 34 y.o.   MRN: [id number]  HPI 34 year old male comes in today complaining of not feeling well. Woke up at 3AM not feeling well but then fell back to sleep. Woke up later and went to get up and his heart started racing.  Felt faint. He felt like he was tingling all over and like his chest was tight that he denies any pain. They called EMS. He had a normal EKG and reassuring vital signs it did not seek further emergency care. He says he still doesn't feel great this afternoon. He does report a similar episode about 4 years ago. He also has not been sleeping well. His wife says he's been having some nightmares. He does have a family history of sleep apnea in his mother.  Later vomited 3 times.   No swelling.  He reports he has had little bit of soreness in his anterior neck. It was a little bit sore to swallow for a few days. No other recent upper respiratory illnesses.  Review of Systems  BP 138/89 mmHg  Pulse 88  Temp(Src) 98.4 F (36.9 C) (Oral)  Resp 18  Wt 298 lb 6.4 oz (135.353 kg)  SpO2 99%    No Known Allergies  Past Medical History  Diagnosis Date  . Anxiety   . Hypertension     Past Surgical History  Procedure Laterality Date  . Tonsillectomy  34 yrs old  . Back surgery      Social History   Social History  . Marital Status: Married    Spouse Name: N/A  . Number of Children: 1  . Years of Education: N/A   Occupational History  .  [employer]   Social History Main Topics  . Smoking status: Never Smoker   . Smokeless tobacco: Never Used  . Alcohol Use: Yes     Comment: socially  . Drug Use: No  . Sexual Activity: Not on file   Other Topics Concern  . Not on file   Social History Narrative    Family History  Problem Relation Age of Onset  . Diabetes Father   . Hypertension Father   . Hyperlipidemia Father   . Other Maternal Grandmother 55    AMI  . Heart disease Maternal  Grandmother 55    AMI  . Diabetes Mother   . Hyperlipidemia Mother   . Hypertension Mother   . Colon cancer Father [id number]  . Rheum arthritis Father     Outpatient Encounter Prescriptions as of 12/03/2014  Medication Sig  . enalapril (VASOTEC) 20 MG tablet Take 1 tablet (20 mg total) by mouth daily.   No facility-administered encounter medications on file as of 12/03/2014.          Objective:   Physical Exam  Constitutional: He is oriented to person, place, and time. He appears well-developed and well-nourished.  HENT:  Head: Normocephalic and atraumatic.  Right Ear: External ear normal.  Left Ear: External ear normal.  Nose: Nose normal.  Mouth/Throat: Oropharynx is clear and moist.  TMs and canals are clear.   Eyes: Conjunctivae and EOM are normal. Pupils are equal, round, and reactive to light.  Neck: Neck supple. No thyromegaly present.  Cardiovascular: Normal rate and normal heart sounds.   Pulmonary/Chest: Effort normal and breath sounds normal.  Musculoskeletal: He exhibits no edema.  Lymphadenopathy:    He has no cervical adenopathy.  Neurological: He  is alert and oriented to person, place, and time.  Skin: Skin is warm and dry.  Psychiatric: He has a normal mood and affect.          Assessment & Plan:  Tachycardia- consider 30 day heart monitor that his symptoms are very infrequent so it may be very difficult to capture the episode. Consider electrolyte disturbance or abnormal thyroid levels and we'll check that as well. He didn't really have chest pain but does report that he had a lot of tightness across his chest so we'll check a CK and troponin. If he has another episode then we will try to get a heart monitor. He did screen positive on his stopping. 6 questions were positive. We'll move forward with ordering a sleep study. Discussed how this is done and he agreed to move forward with this.  Chest tightness. See note above. EKG today shows. NSR, 66 bpm, no acute  changes.    Snoring-sleep study ordered. See note above.  STOP BANG score of 6.

## 2014-12-04 LAB — CBC WITH DIFFERENTIAL/PLATELET
Basophils Absolute: 0 10*3/uL (ref 0.0–0.1)
Basophils Relative: 0 % (ref 0–1)
Eosinophils Absolute: 0.1 10*3/uL (ref 0.0–0.7)
Eosinophils Relative: 2 % (ref 0–5)
HCT: 43.9 % (ref 39.0–52.0)
Hemoglobin: 15 g/dL (ref 13.0–17.0)
Lymphocytes Relative: 28 % (ref 12–46)
Lymphs Abs: 2 10*3/uL (ref 0.7–4.0)
MCH: 31.4 pg (ref 26.0–34.0)
MCHC: 34.2 g/dL (ref 30.0–36.0)
MCV: 91.8 fL (ref 78.0–100.0)
MPV: 9.1 fL (ref 8.6–12.4)
Monocytes Absolute: 0.5 10*3/uL (ref 0.1–1.0)
Monocytes Relative: 7 % (ref 3–12)
Neutro Abs: 4.6 10*3/uL (ref 1.7–7.7)
Neutrophils Relative %: 63 % (ref 43–77)
Platelets: 243 10*3/uL (ref 150–400)
RBC: 4.78 MIL/uL (ref 4.22–5.81)
RDW: 12.9 % (ref 11.5–15.5)
WBC: 7.3 10*3/uL (ref 4.0–10.5)

## 2014-12-04 LAB — COMPLETE METABOLIC PANEL WITH GFR
ALT: 31 U/L (ref 9–46)
AST: 26 U/L (ref 10–40)
Albumin: 4.4 g/dL (ref 3.6–5.1)
Alkaline Phosphatase: 64 U/L (ref 40–115)
BUN: 17 mg/dL (ref 7–25)
CO2: 24 mmol/L (ref 20–31)
Calcium: 9.5 mg/dL (ref 8.6–10.3)
Chloride: 104 mmol/L (ref 98–110)
Creat: 1.07 mg/dL (ref 0.60–1.35)
GFR, Est African American: >89 mL/min (ref >=60)
GFR, Est Non African American: >89 mL/min (ref >=60)
Glucose, Bld: 82 mg/dL (ref 65–99)
Potassium: 4 mmol/L (ref 3.5–5.3)
Sodium: 137 mmol/L (ref 135–146)
Total Bilirubin: 1 mg/dL (ref 0.2–1.2)
Total Protein: 7.6 g/dL (ref 6.1–8.1)

## 2014-12-04 LAB — TSH: TSH: 2.98 u[IU]/mL (ref 0.350–4.500)

## 2014-12-04 LAB — TROPONIN I: Troponin I: <0.01 ng/mL (ref <0.06)

## 2014-12-04 LAB — CK TOTAL AND CKMB (NOT AT ARMC)
CK, MB: <0.7 ng/mL (ref 0.0–5.0)
Total CK: 110 U/L (ref 7–232)

## 2014-12-05 NOTE — Progress Notes (Signed)
Quick Note:  All labs are normal. ______ 

## 2014-12-09 NOTE — Addendum Note (Signed)
Addended by: Collie SiadICHARDSON, Beda Dula M on: 12/09/2014 04:50 PM   Modules accepted: Orders

## 2015-01-12 ENCOUNTER — Encounter: Payer: Self-pay | Admitting: *Deleted

## 2015-01-12 ENCOUNTER — Emergency Department
Admission: EM | Admit: 2015-01-12 | Discharge: 2015-01-12 | Disposition: A | Discharge status: Home / Self Care | Payer: BLUE CROSS/BLUE SHIELD | Source: Home / Self Care | Attending: Family Medicine | Admitting: Family Medicine

## 2015-01-12 DIAGNOSIS — J069 Acute upper respiratory infection, unspecified: Secondary | ICD-10-CM | POA: Diagnosis not present

## 2015-01-12 MED ORDER — AMOXICILLIN 875 MG PO TABS: 875.0000 mg | ORAL_TABLET | Freq: Two times a day (BID) | ORAL | Status: DC

## 2015-01-12 NOTE — ED Provider Notes (Signed)
CSN: [id number]     Arrival date & time 01/12/15  1506 History   First MD Initiated Contact with Patient 01/12/15 1631     Chief Complaint  Patient presents with  . Nasal Congestion      HPI Comments: Patient complains of three day history of typical cold-like symptoms including mild sore throat, sinus congestion, headache, fatigue, chills, and myalgias.  No cough.  He has a past history of left otitis media, and his left ear feels clogged.  The history is provided by the patient.    Past Medical History  Diagnosis Date  . Anxiety   . Hypertension    Past Surgical History  Procedure Laterality Date  . Tonsillectomy  35 yrs old  . Back surgery     Family History  Problem Relation Age of Onset  . Diabetes Father   . Hypertension Father   . Hyperlipidemia Father   . Other Maternal Grandmother 55    AMI  . Heart disease Maternal Grandmother 55    AMI  . Diabetes Mother   . Hyperlipidemia Mother   . Hypertension Mother   . Colon cancer Father 40  . Rheum arthritis Father    Social History  Substance Use Topics  . Smoking status: Never Smoker   . Smokeless tobacco: Never Used  . Alcohol Use: Yes     Comment: socially    Review of Systems + sore throat No cough No pleuritic pain No wheezing + nasal congestion + post-nasal drainage + sinus pain/pressure No itchy/red eyes ? earache No hemoptysis No SOB No fever, + chills No nausea No vomiting No abdominal pain No diarrhea No urinary symptoms No skin rash + fatigue + myalgias + headache Used OTC meds without relief  Allergies  Review of patient's allergies indicates no known allergies.  Home Medications   Prior to Admission medications   Medication Sig Start Date End Date Taking? Authorizing Provider  enalapril (VASOTEC) 20 MG tablet Take 1 tablet (20 mg total) by mouth daily. 10/08/14   Agapito Games, MD   Meds Ordered and Administered this Visit  Medications - No data to display  BP 120/78  mmHg  Pulse 77  Temp(Src) 98.5 F (36.9 C) (Oral)  Resp 18  Ht 6' (1.829 m)  Wt 298 lb (135.172 kg)  BMI 40.41 kg/m2  SpO2 97% No data found.   Physical Exam Nursing notes and Vital Signs reviewed. Appearance:  Patient appears stated age, and in no acute distress.  Patient is obese (BMI 40.4) Eyes:  Pupils are equal, round, and reactive to light and accomodation.  Extraocular movement is intact.  Conjunctivae are not inflamed  Ears:  Canals normal.  Right tympanic membrane normal.  Left tympanic membrane scarred with decreased landmarks. Nose:  Congested turbinates.  No sinus tenderness.     Pharynx:  Normal Neck:  Supple.  Tender enlarged posterior nodes are palpated bilaterally  Lungs:  Clear to auscultation.  Breath sounds are equal.  Moving air well. Heart:  Regular rate and rhythm without murmurs, rubs, or gallops.  Abdomen:  Nontender without masses or hepatosplenomegaly.  Bowel sounds are present.  No CVA or flank tenderness.  Extremities:  No edema.  Skin:  No rash present.   ED Course  Procedures  none  MDM   1. Viral URI    With a history of recurring left otitis media, begin amoxicillin.  May continue Mucinex D for cough and congestion.  Increase fluid intake. Get adequate  rest.   May use Afrin nasal spray (or generic oxymetazoline) twice daily for about 5 days and then discontinue.  Also recommend using saline nasal spray several times daily and saline nasal irrigation (AYR is a common brand).  Use Flonase nasal spray each morning after using Afrin nasal spray and saline nasal irrigation. Try warm salt water gargles for sore throat.  Stop all antihistamines for now, and other non-prescription cough/cold preparations. May take Delsym Cough Suppressant at bedtime for nighttime cough.  Follow-up with family doctor if not improving about10 days.    Lattie HawStephen A Rashaan Wyles, MD 01/13/15 [phone]

## 2015-01-12 NOTE — ED Notes (Signed)
Pt c/o nasal congestion and LT ear ache x 2-3 days with sore throat in the morning that resolves during the day.

## 2015-01-12 NOTE — Discharge Instructions (Signed)
May continue Mucinex D for cough and congestion.  Increase fluid intake. Get adequate rest.   May use Afrin nasal spray (or generic oxymetazoline) twice daily for about 5 days and then discontinue.  Also recommend using saline nasal spray several times daily and saline nasal irrigation (AYR is a common brand).  Use Flonase nasal spray each morning after using Afrin nasal spray and saline nasal irrigation. Try warm salt water gargles for sore throat.  Stop all antihistamines for now, and other non-prescription cough/cold preparations. May take Delsym Cough Suppressant at bedtime for nighttime cough.  Follow-up with family doctor if not improving about10 days.

## 2015-02-01 ENCOUNTER — Ambulatory Visit (HOSPITAL_BASED_OUTPATIENT_CLINIC_OR_DEPARTMENT_OTHER): Payer: BLUE CROSS/BLUE SHIELD | Attending: Family Medicine

## 2015-02-01 VITALS — Ht 72.0 in | Wt 298.0 lb

## 2015-02-01 DIAGNOSIS — G4733 Obstructive sleep apnea (adult) (pediatric): Secondary | ICD-10-CM | POA: Diagnosis not present

## 2015-02-01 DIAGNOSIS — R0683 Snoring: Secondary | ICD-10-CM | POA: Diagnosis not present

## 2015-02-01 DIAGNOSIS — G4700 Insomnia, unspecified: Secondary | ICD-10-CM | POA: Diagnosis not present

## 2015-02-05 DIAGNOSIS — G4733 Obstructive sleep apnea (adult) (pediatric): Secondary | ICD-10-CM | POA: Diagnosis not present

## 2015-02-05 DIAGNOSIS — R0683 Snoring: Secondary | ICD-10-CM | POA: Diagnosis not present

## 2015-02-05 NOTE — Progress Notes (Signed)
  Patient Name: Louis Phillips, Louis Phillips Date: 02/01/2015 Gender: Male D.O.B: 1980/08/10 Age (years): 34 Referring Provider: Nani Gasser Height (inches): 72 Interpreting Physician: Jetty Duhamel MD, ABSM Weight (lbs): 298 RPSGT: Melburn Popper BMI: 40 MRN: [id number] Neck Size: 18.50 CLINICAL INFORMATION Sleep Study Type: NPSG Indication for sleep study: Snoring Epworth Sleepiness Score:  SLEEP STUDY TECHNIQUE As per the AASM Manual for the Scoring of Sleep and Associated Events v2.3 (April 2016) with a hypopnea requiring 4% desaturations. The channels recorded and monitored were frontal, central and occipital EEG, electrooculogram (EOG), submentalis EMG (chin), nasal and oral airflow, thoracic and abdominal wall motion, anterior tibialis EMG, snore microphone, electrocardiogram, and pulse oximetry.  MEDICATIONS Patient's medications include: charted for review Medications self-administered by patient during sleep study : No sleep medicine administered.  SLEEP ARCHITECTURE The study was initiated at 10:09:39 PM and ended at 4:34:25 AM. Sleep onset time was 30.8 minutes and the sleep efficiency was 50.8%. The total sleep time was 195.5 minutes. Stage REM latency was 237.0 minutes. The patient spent 15.09% of the night in stage N1 sleep, 50.63% in stage N2 sleep, 23.28% in stage N3 and 11.00% in REM. Alpha intrusion was absent. Supine sleep was 75.20%. Wake after sleep onset 158.5 minutes  RESPIRATORY PARAMETERS The overall apnea/hypopnea index (AHI) was 11.1 per hour. There were 1 total apneas, including 0 obstructive, 1 central and 0 mixed apneas. There were 35 hypopneas and 12 RERAs. The AHI during Stage REM sleep was 11.2 per hour. AHI while supine was 14.3 per hour. The mean oxygen saturation was 93.78%. The minimum SpO2 during sleep was 88.00%. Moderate snoring was noted during this study.  CARDIAC DATA The 2 lead EKG demonstrated sinus rhythm. The mean heart rate  was 62.08 beats per minute. Other EKG findings include: None.  LEG MOVEMENT DATA The total PLMS were 15 with a resulting PLMS index of 4.60. Associated arousal with leg movement index was 0.6 .  IMPRESSIONS - Mild obstructive sleep apnea occurred during this study (AHI = 11.1/h). - Significant difficulty initiating and maintaining sleep - There were not enough sleep or early respiratory events to meet protocol requirements for split  CPAP titration - No significant central sleep apnea occurred during this study (CAI = 0.3/h). - Mild oxygen desaturation was noted during this study (Min O2 = 88.00%). - The patient snored with Moderate snoring volume. - No cardiac abnormalities were noted during this study. - Clinically significant periodic limb movements did not occur during sleep. No significant associated arousals.  DIAGNOSIS - Obstructive Sleep Apnea (327.23 [G47.33 ICD-10]) - Dificulty initiating and maintaining sleep- Insomnia  RECOMMENDATIONS - Conservative measures such as weight loss may be sufficient. Otherwise, CPAP titration or a fitted oral appliance might be appropriate. - Positional therapy avoiding supine position during sleep. - Avoid alcohol, sedatives and other CNS depressants that may worsen sleep apnea and disrupt normal sleep architecture. - Sleep hygiene should be reviewed to assess factors that may improve sleep quality. - Weight management and regular exercise should be initiated or continued if appropriate.  Waymon Budge Diplomate, American Board of Sleep Medicine  ELECTRONICALLY SIGNED ON:  02/05/2015, 10:13 AM [facility] PH: [phone]   FX: [phone] ACCREDITED BY THE AMERICAN ACADEMY OF SLEEP MEDICINE

## 2015-02-09 ENCOUNTER — Telehealth: Payer: Self-pay | Admitting: Family Medicine

## 2015-02-09 NOTE — Telephone Encounter (Signed)
Call pt: i got his sleep study results in . Have him schedule appt to review results. They were positive. He does have sleep apena.

## 2015-02-10 NOTE — Telephone Encounter (Signed)
Pt advised of results and recommendation for follow up. Was added to the schedule for next week.

## 2015-02-17 ENCOUNTER — Ambulatory Visit (INDEPENDENT_AMBULATORY_CARE_PROVIDER_SITE_OTHER): Payer: BLUE CROSS/BLUE SHIELD | Admitting: Family Medicine

## 2015-02-17 ENCOUNTER — Encounter: Payer: Self-pay | Admitting: Family Medicine

## 2015-02-17 VITALS — BP 146/80 | HR 57 | Ht 72.0 in | Wt 307.4 lb

## 2015-02-17 DIAGNOSIS — K148 Other diseases of tongue: Secondary | ICD-10-CM | POA: Diagnosis not present

## 2015-02-17 DIAGNOSIS — G4730 Sleep apnea, unspecified: Secondary | ICD-10-CM

## 2015-02-17 NOTE — Progress Notes (Signed)
Subjective:    Patient ID: Louis Phillips, male    DOB: Feb 10, 1980, 35 y.o.   MRN: [id number]  HPI OSA, mild - her to go over sleep study reslults.  He went for sleep study on January 31. He was noted to have an apnea-hypopnea index of 11.1. It was worse when he was supine. Snoring was moderate. He actually did not sleep that well as he said he did have a bit of a cold. He is feeling better as far as that's concerned. They did not note any periodic limb movement .  His oxygen desaturated to 88%.  Lesions on right posterior side of tongue x 3 weeks. Noticed it when he was sick. Says not painful.    Review of Systems  BP 146/80 mmHg  Pulse 57  Ht 6' (1.829 m)  Wt 307 lb 6.4 oz (139.436 kg)  BMI 41.68 kg/m2  SpO2 95%    No Known Allergies  Past Medical History  Diagnosis Date  . Anxiety   . Hypertension     Past Surgical History  Procedure Laterality Date  . Tonsillectomy  35 yrs old  . Back surgery      Social History   Social History  . Marital Status: Married    Spouse Name: N/A  . Number of Children: 1  . Years of Education: N/A   Occupational History  .  [employer]   Social History Main Topics  . Smoking status: Never Smoker   . Smokeless tobacco: Never Used  . Alcohol Use: Yes     Comment: socially  . Drug Use: No  . Sexual Activity: Not on file   Other Topics Concern  . Not on file   Social History Narrative    Family History  Problem Relation Age of Onset  . Diabetes Father   . Hypertension Father   . Hyperlipidemia Father   . Other Maternal Grandmother 55    AMI  . Heart disease Maternal Grandmother 55    AMI  . Diabetes Mother   . Hyperlipidemia Mother   . Hypertension Mother   . Colon cancer Father 12  . Rheum arthritis Father     Outpatient Encounter Prescriptions as of 02/17/2015  Medication Sig  . enalapril (VASOTEC) 20 MG tablet Take 1 tablet (20 mg total) by mouth daily.  . [DISCONTINUED] amoxicillin (AMOXIL) 875 MG tablet  Take 1 tablet (875 mg total) by mouth 2 (two) times daily.   No facility-administered encounter medications on file as of 02/17/2015.     '     Objective:   Physical Exam  Constitutional: He is oriented to person, place, and time. He appears well-developed and well-nourished.  HENT:  Head: Normocephalic and atraumatic.  Posterior right lateral time he has 2 small lesions that almost look like skin tags but are the color of the tongue.  Eyes: Conjunctivae and EOM are normal.  Cardiovascular: Normal rate.   Pulmonary/Chest: Effort normal.  Neurological: He is alert and oriented to person, place, and time.  Skin: Skin is dry. No pallor.  Psychiatric: He has a normal mood and affect. His behavior is normal.  Vitals reviewed.         Assessment & Plan:  Mild sleep apnea-reviewed his sleep test results with him today. We also discussed treatment options. Like to move forward with getting CPAP set up. We will order it through home health and have them set on AutoPap for 3 nights and then send [place]  the results. We can then set his pressure. Discussed weight loss.    Obesity/BMI 41-discussed strategies to work on weight loss. Discussed cutting back on eating out. He says he might be interested in a weight loss treatment program. I discussed that we do do some weight loss here but certainly a comprehensive program would be able to offer him more options. Encouraged him to think about it and let me know what he would like to do. Also encouraged him to work on portion control.  He has gained 50 lbs in 3 years.   Tongue lesion-refer to ENT for possible biopsy. They look very much like a skin tag. Consider HPV.

## 2015-03-17 ENCOUNTER — Encounter: Payer: Self-pay | Admitting: Family Medicine

## 2015-03-17 ENCOUNTER — Ambulatory Visit (INDEPENDENT_AMBULATORY_CARE_PROVIDER_SITE_OTHER): Payer: BLUE CROSS/BLUE SHIELD | Admitting: Family Medicine

## 2015-03-17 VITALS — BP 138/84 | HR 84 | Wt 302.0 lb

## 2015-03-17 DIAGNOSIS — G4733 Obstructive sleep apnea (adult) (pediatric): Secondary | ICD-10-CM

## 2015-03-17 DIAGNOSIS — M5416 Radiculopathy, lumbar region: Secondary | ICD-10-CM | POA: Diagnosis not present

## 2015-03-17 MED ORDER — PREDNISONE 20 MG PO TABS: 40.0000 mg | ORAL_TABLET | Freq: Every day | ORAL | Status: DC

## 2015-03-17 MED ORDER — AMBULATORY NON FORMULARY MEDICATION
Status: DC
Start: 2015-03-17 — End: 2016-01-15

## 2015-03-17 NOTE — Patient Instructions (Signed)
Please schedule with one of our sports medicine doctors for your sciatica.

## 2015-03-17 NOTE — Progress Notes (Signed)
   Subjective:    Patient ID: Louis Phillips, male    DOB: 09/29/1980, 35 y.o.   MRN: [id number]  HPI Patient comes in today to follow-up on his sleep apnea.   He also complains of low back pain with radicular symptoms into the left leg. He actually has had problems with his low back and sciatica for several years. In fact back in 2011 he had a discectomy. Then he reinjured the area in 2015 and underwent an epidural injection. He said that action provided relief for a little over a year. He is now starting to get the total ache down the left leg. No recent repeat injury. He's not currently taking any medications for it. He thinks his last MRI was about a year ago.   Review of Systems     Objective:   Physical Exam  Constitutional: He appears well-developed and well-nourished.  HENT:  Head: Normocephalic and atraumatic.  Musculoskeletal:  Nontender over the lumbar spine. I can see a well-healed surgical scar. Nontender over her SI joints. Positive straight leg raise on the left. Patellar reflexes 2+ bilaterally. Normal flexion extension and rotation of lumbar spine.  Skin: Skin is warm and dry.  Psychiatric: He has a normal mood and affect. His behavior is normal.          Assessment & Plan:  OSA  - will call and see if they can get him supplies ASAP.    Low back pain with radiculopathy, left side-recommend a trial of oral prednisone. Will see if we can find his old MRI and get him in with one of our sports medicine doctors hopefully sometime next week.  Last MRI was from July 2015 done at [facility]. It is scanned into the chart. It did show that he had new disc herniations at L4-5 and L5-S1.

## 2015-04-21 ENCOUNTER — Ambulatory Visit: Payer: BLUE CROSS/BLUE SHIELD | Admitting: Family Medicine

## 2015-05-02 ENCOUNTER — Ambulatory Visit (INDEPENDENT_AMBULATORY_CARE_PROVIDER_SITE_OTHER): Payer: BLUE CROSS/BLUE SHIELD | Admitting: Family Medicine

## 2015-05-02 DIAGNOSIS — M5440 Lumbago with sciatica, unspecified side: Secondary | ICD-10-CM

## 2015-05-02 NOTE — Progress Notes (Signed)
Patient has been deemed a "no-show" for today's scheduled appointment.  Based on chief complaint and my chart review: -No follow-up necessary.   If necessary this was communicated to the patient via phone or mail on: 9:41 PM 05/02/2015

## 2015-07-12 ENCOUNTER — Other Ambulatory Visit: Payer: Self-pay | Admitting: Family Medicine

## 2015-08-08 ENCOUNTER — Other Ambulatory Visit: Payer: Self-pay | Admitting: Family Medicine

## 2015-08-29 ENCOUNTER — Ambulatory Visit (INDEPENDENT_AMBULATORY_CARE_PROVIDER_SITE_OTHER): Payer: BLUE CROSS/BLUE SHIELD | Admitting: Family Medicine

## 2015-08-29 ENCOUNTER — Encounter: Payer: Self-pay | Admitting: Family Medicine

## 2015-08-29 VITALS — BP 121/58 | HR 81 | Wt 242.0 lb

## 2015-08-29 DIAGNOSIS — Z23 Encounter for immunization: Secondary | ICD-10-CM | POA: Diagnosis not present

## 2015-08-29 DIAGNOSIS — Z6832 Body mass index (BMI) 32.0-32.9, adult: Secondary | ICD-10-CM

## 2015-08-29 DIAGNOSIS — E669 Obesity, unspecified: Secondary | ICD-10-CM

## 2015-08-29 DIAGNOSIS — I10 Essential (primary) hypertension: Secondary | ICD-10-CM | POA: Diagnosis not present

## 2015-08-29 DIAGNOSIS — G4733 Obstructive sleep apnea (adult) (pediatric): Secondary | ICD-10-CM | POA: Diagnosis not present

## 2015-08-29 MED ORDER — ENALAPRIL MALEATE 20 MG PO TABS: ORAL_TABLET | ORAL | 1 refills | Status: DC

## 2015-08-29 NOTE — Progress Notes (Signed)
Subjective:    CC: HTN  HPI:  Hypertension- Pt denies chest pain, SOB, dizziness, or heart palpitations.  Taking meds as directed w/o problems.  Denies medication side effects.    OSA - he is not using his CPAP bc he has lost 60 lbs.  He felt much better with the weight loss and in fact his wife is said that his snoring has ceased. He feels like he sleeping well.  Obesity-I last saw him his BMI was 40. He says since then he has cut out soda and caffeinated products. He says he feels so much better. He is trying to exercise more regularly. He is no longer snoring.  Past medical history, Surgical history, Family history not pertinant except as noted below, Social history, Allergies, and medications have been entered into the medical record, reviewed, and corrections made.   Review of Systems: No fevers, chills, night sweats, weight loss, chest pain, or shortness of breath.   Objective:    General: Well Developed, well nourished, and in no acute distress.  Neuro: Alert and oriented x3, extra-ocular muscles intact, sensation grossly intact.  HEENT: Normocephalic, atraumatic  Skin: Warm and dry, no rashes. Cardiac: Regular rate and rhythm, no murmurs rubs or gallops, no lower extremity edema.  Respiratory: Clear to auscultation bilaterally. Not using accessory muscles, speaking in full sentences.   Impression and Recommendations:   HTN - Well controlled. Continue current regimen. Follow up in 6 mo.  If BP going low can dec to half tab daily and schedule nurse visit.    OSA - He would still like to lose about 30 more pounds. I think once he get to goal weight we can retest his sleep apnea and see if it may have resolved.  Obesity - he is done fantastic and has lost 60 pounds. Keep up the good work and encouraged him to continue work on regular exercise.

## 2015-08-30 LAB — COMPLETE METABOLIC PANEL WITH GFR
ALT: 12 U/L (ref 9–46)
AST: 17 U/L (ref 10–40)
Albumin: 4.6 g/dL (ref 3.6–5.1)
Alkaline Phosphatase: 55 U/L (ref 40–115)
BUN: 16 mg/dL (ref 7–25)
CO2: 27 mmol/L (ref 20–31)
Calcium: 9.8 mg/dL (ref 8.6–10.3)
Chloride: 105 mmol/L (ref 98–110)
Creat: 1.09 mg/dL (ref 0.60–1.35)
GFR, Est African American: >89 mL/min (ref >=60)
GFR, Est Non African American: 89 mL/min (ref >=60)
Glucose, Bld: 87 mg/dL (ref 65–99)
Potassium: 4 mmol/L (ref 3.5–5.3)
Sodium: 139 mmol/L (ref 135–146)
Total Bilirubin: 0.7 mg/dL (ref 0.2–1.2)
Total Protein: 7.3 g/dL (ref 6.1–8.1)

## 2015-08-30 LAB — LIPID PANEL
Cholesterol: 219 mg/dL — ABNORMAL HIGH (ref 125–200)
HDL: 36 mg/dL — ABNORMAL LOW (ref >=40)
LDL Cholesterol: 157 mg/dL — ABNORMAL HIGH (ref <130)
Total CHOL/HDL Ratio: 6.1 Ratio — ABNORMAL HIGH (ref <=5.0)
Triglycerides: 132 mg/dL (ref <150)
VLDL: 26 mg/dL (ref <30)

## 2015-08-31 ENCOUNTER — Other Ambulatory Visit: Payer: Self-pay | Admitting: Family Medicine

## 2015-09-09 ENCOUNTER — Emergency Department
Admission: EM | Admit: 2015-09-09 | Discharge: 2015-09-09 | Disposition: A | Discharge status: Home / Self Care | Payer: BLUE CROSS/BLUE SHIELD | Source: Home / Self Care | Attending: Family Medicine | Admitting: Family Medicine

## 2015-09-09 ENCOUNTER — Emergency Department (INDEPENDENT_AMBULATORY_CARE_PROVIDER_SITE_OTHER): Payer: BLUE CROSS/BLUE SHIELD

## 2015-09-09 ENCOUNTER — Encounter: Payer: Self-pay | Admitting: *Deleted

## 2015-09-09 DIAGNOSIS — M79672 Pain in left foot: Secondary | ICD-10-CM | POA: Diagnosis not present

## 2015-09-09 DIAGNOSIS — B070 Plantar wart: Secondary | ICD-10-CM

## 2015-09-09 DIAGNOSIS — M2012 Hallux valgus (acquired), left foot: Secondary | ICD-10-CM | POA: Diagnosis not present

## 2015-09-09 MED ORDER — SALICYLIC ACID 40 % EX PADS: MEDICATED_PAD | CUTANEOUS | 1 refills | Status: DC

## 2015-09-09 NOTE — Discharge Instructions (Signed)
°  You may use the pads for up to 12 weeks, however, if symptoms do not appear to be improving in 1-2 weeks, or if symptoms worse, please follow up with your primary care provider for recheck of symptoms and possibly additional treatment.

## 2015-09-09 NOTE — ED Provider Notes (Signed)
CSN: [id number]     Arrival date & time 09/09/15  1413 History   First MD Initiated Contact with Patient 09/09/15 1424     Chief Complaint  Patient presents with  . Foot Pain   (Consider location/radiation/quality/duration/timing/severity/associated sxs/prior Treatment) HPI  Louis Phillips is a 35 y.o. male presenting to UC with c/o 1 week of gradually worsening Left foot pain at that ball of his foot.  Pain is aching and sore, worse with palpation and ambulation.  He reports feeling thickened skin on the bottom of his foot described as :small bumps that go up into my foot."  Pain is sharp and aching, 8/10.  He did use a pummel stone to help exfoliate the calloused skin but no relief.  Pt states he walks on concrete floors all day. No known injury. No hx of similar symptoms. Denies numbness or tingling in foot.    Past Medical History:  Diagnosis Date  . Anxiety   . Hypertension    Past Surgical History:  Procedure Laterality Date  . BACK SURGERY    . TONSILLECTOMY  35 yrs old   Family History  Problem Relation Age of Onset  . Diabetes Father   . Hypertension Father   . Hyperlipidemia Father   . Colon cancer Father [id number]  . Rheum arthritis Father   . Diabetes Mother   . Hyperlipidemia Mother   . Hypertension Mother   . Other Maternal Grandmother 55    AMI  . Heart disease Maternal Grandmother [phone]    AMI   Social History  Substance Use Topics  . Smoking status: Never Smoker  . Smokeless tobacco: Never Used  . Alcohol use Yes     Comment: socially    Review of Systems  Musculoskeletal: Positive for arthralgias, gait problem (due to pain in left foot) and myalgias. Negative for joint swelling.       Bottom of Left foot  Skin: Positive for color change and wound. Negative for rash.  Neurological: Negative for weakness and numbness.    Allergies  Review of patient's allergies indicates no known allergies.  Home Medications   Prior to Admission medications   Medication  Sig Start Date End Date Taking? Authorizing Provider  AMBULATORY NON FORMULARY MEDICATION Medication Name CPAP set to autopap - setting between 2-20.  Dx OSA.  CPAP, humidifier and suppleies Patient not taking: Reported on 08/29/2015 03/17/15   Agapito Gamesatherine D Metheney, MD  enalapril (VASOTEC) 20 MG tablet TAKE 1 TABLET (20 MG TOTAL) BY MOUTH DAILY. 08/29/15   Agapito Gamesatherine D Metheney, MD  Salicylic Acid 40 % PADS Apply 1 pad to sore spot, change every 2 days 09/09/15   Junius FinnerErin O'Malley, PA-C   Meds Ordered and Administered this Visit  Medications - No data to display  BP 127/82 (BP Location: Left Arm)   Pulse 86   Wt 243 lb (110.2 kg)   SpO2 97%   BMI 32.96 kg/m  No data found.   Physical Exam  Constitutional: He is oriented to person, place, and time. He appears well-developed and well-nourished.  HENT:  Head: Normocephalic and atraumatic.  Eyes: EOM are normal.  Neck: Normal range of motion.  Cardiovascular: Normal rate.   Pulses:      Dorsalis pedis pulses are 2+ on the left side.  Pulmonary/Chest: Effort normal.  Musculoskeletal: Normal range of motion. He exhibits tenderness. He exhibits no edema.  Left foot: full ROM, tenderness to plantar aspect of foot from mid to distal 1st  metatarsal (see skin exam)  Neurological: He is alert and oriented to person, place, and time.  Skin: Skin is warm and dry. Capillary refill takes less than 2 seconds.  Left foot, plantar aspect near distal aspect of 1st metatarsal- 2cm area of thickened calloused skin, scant red blood (c/w pt using pummel stone at home to exfoliate skin). Area is tender. No active bleeding or discharge.   Psychiatric: He has a normal mood and affect. His behavior is normal.  Nursing note and vitals reviewed.   Urgent Care Course   Clinical Course    Procedures (including critical care time)  Labs Review Labs Reviewed - No data to display  Imaging Review Dg Foot Complete Left  Result Date: 09/09/2015 CLINICAL DATA:  Left  foot pain for 1.5 weeks. Pain is on the plantar surface of the foot at the great toe. EXAM: LEFT FOOT - COMPLETE 3+ VIEW COMPARISON:  None. FINDINGS: No acute bony or joint abnormality is seen. Hallux valgus deformity is noted. No evidence of arthropathy. Soft tissues are unremarkable. IMPRESSION: Hallux valgus.  Otherwise negative. Electronically Signed   By: Drusilla Kanner M.D.   On: 09/09/2015 14:57      MDM   1. Left foot pain   2. Plantar wart, left foot    Pt c/o Left foot pain with calloused skin.  Exam c/w plantar wart.   Rx: salicylic acid 40% pads.  Advised he may use for up to 12 weeks, however, encouraged f/u with PCP in 1-2 weeks if no improvement as he may need additional treatment such as cryotherapy.  Patient verbalized understanding and agreement with treatment plan.     Junius Finner, PA-C 09/09/15 1523

## 2015-09-09 NOTE — ED Triage Notes (Signed)
Pt c/o 1 week of left foot pain @ the ball of his foot. Described as "small bumps that go up into my foot". Pain is worse with walking. Taken tylenol otc.

## 2015-09-11 ENCOUNTER — Telehealth: Payer: Self-pay | Admitting: Emergency Medicine

## 2015-09-18 IMAGING — US US ABDOMEN COMPLETE
1 series · 13 of 25 positions shown · non-contrast
Comparison: None.

CLINICAL DATA: Lesion in the inferior pole of the right kidney on
MRI at outside facility

EXAM:
ULTRASOUND ABDOMEN COMPLETE

[Series 1: us abdomen complete · 0.35mm/px · 13 of 84 slices shown]
[im 1/84]
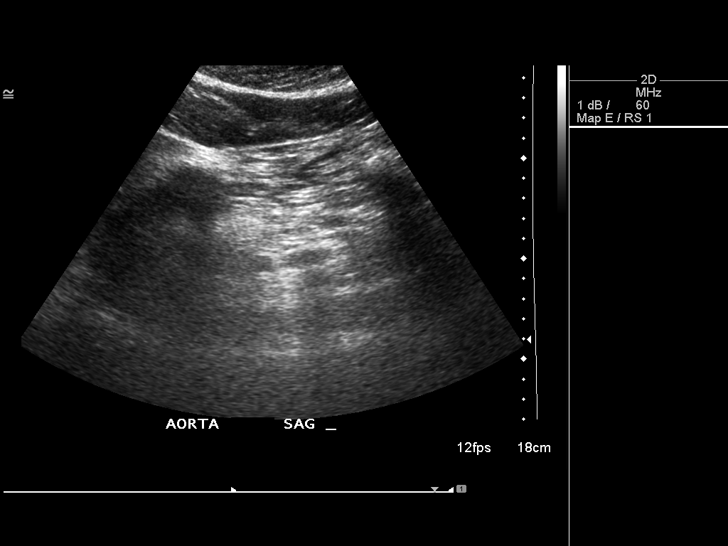
[im 7/84]
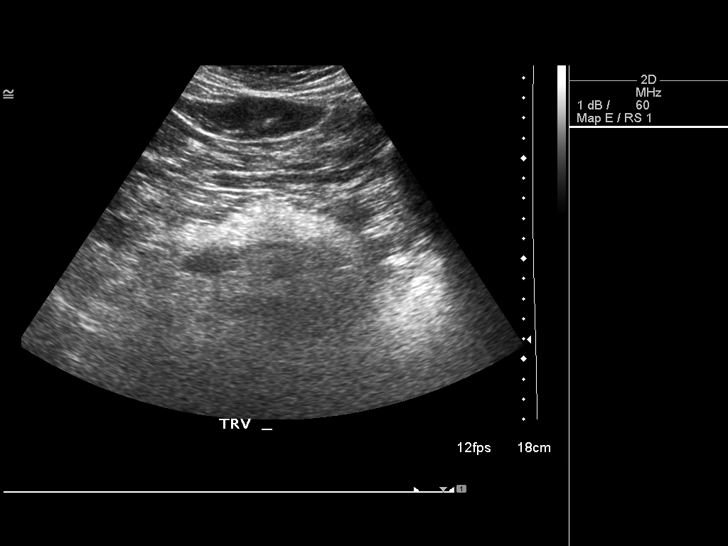
[im 14/84]
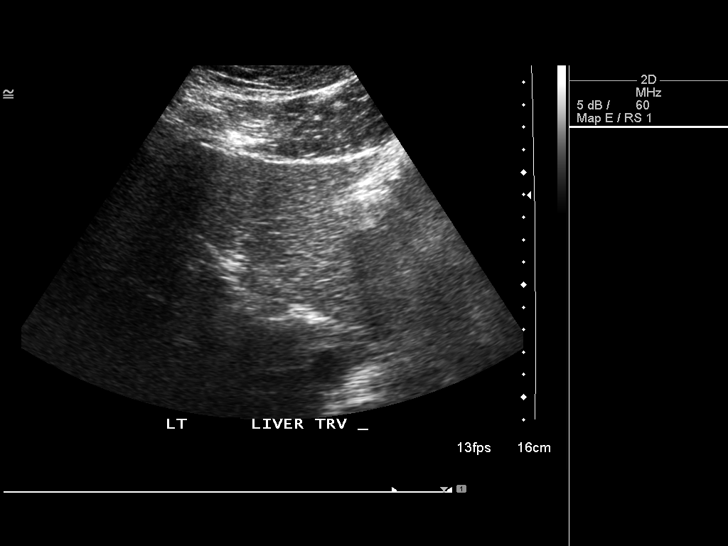
[im 21/84]
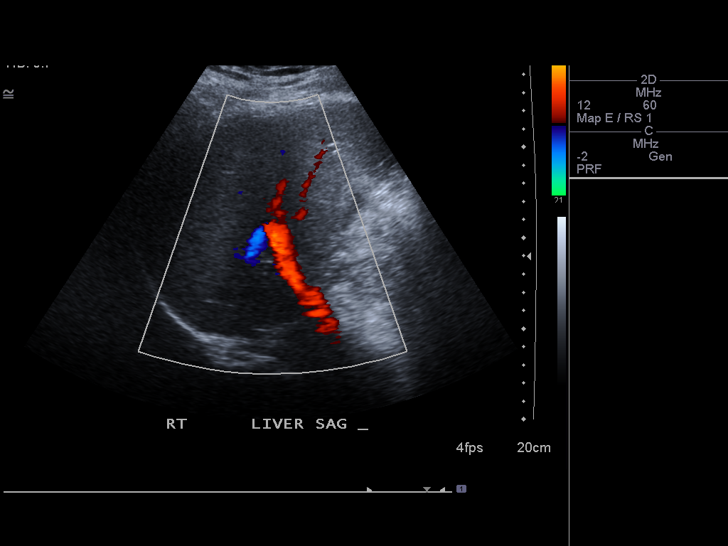
[im 28/84]
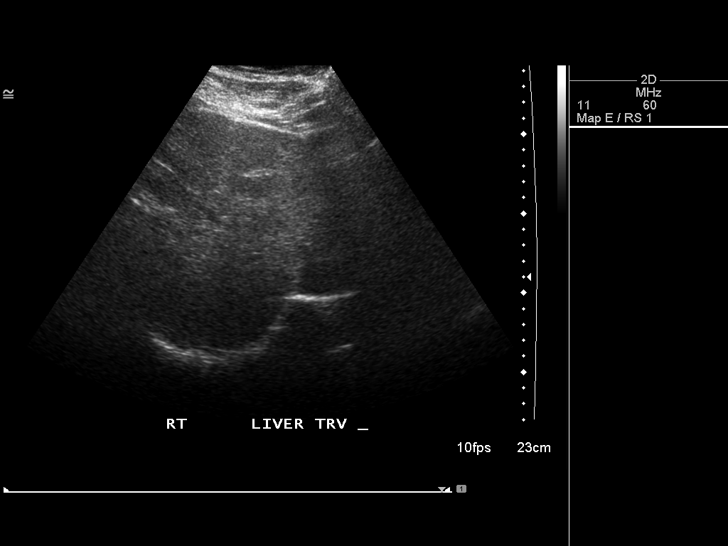
[im 35/84]
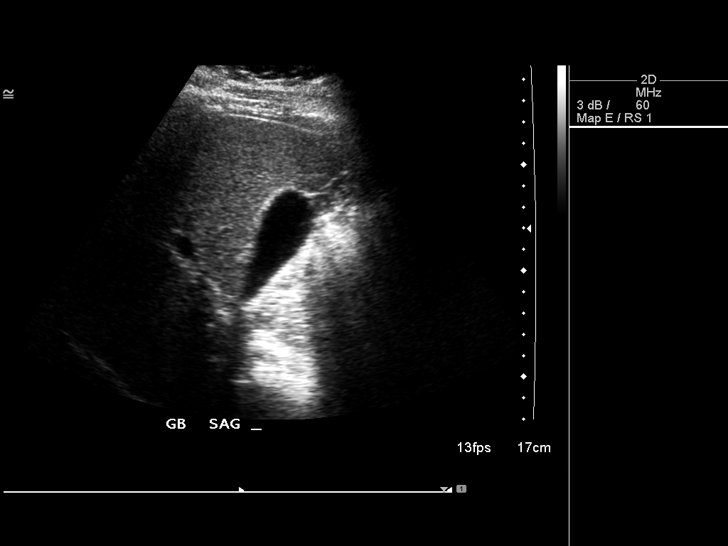
[im 42/84]
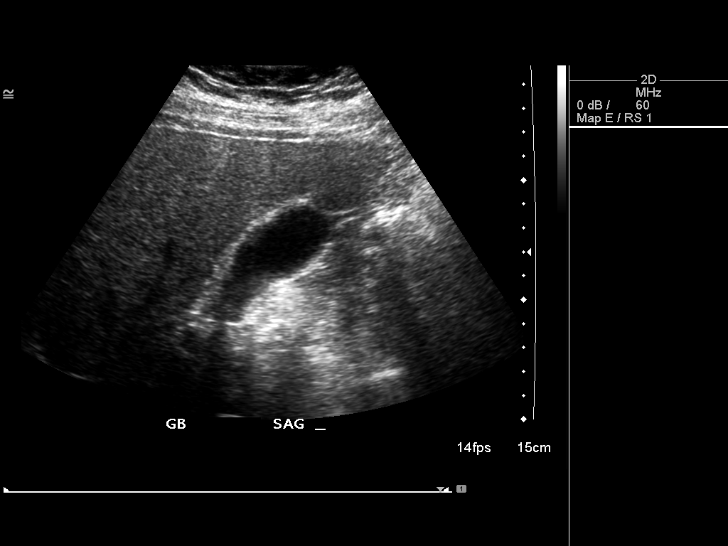
[im 49/84]
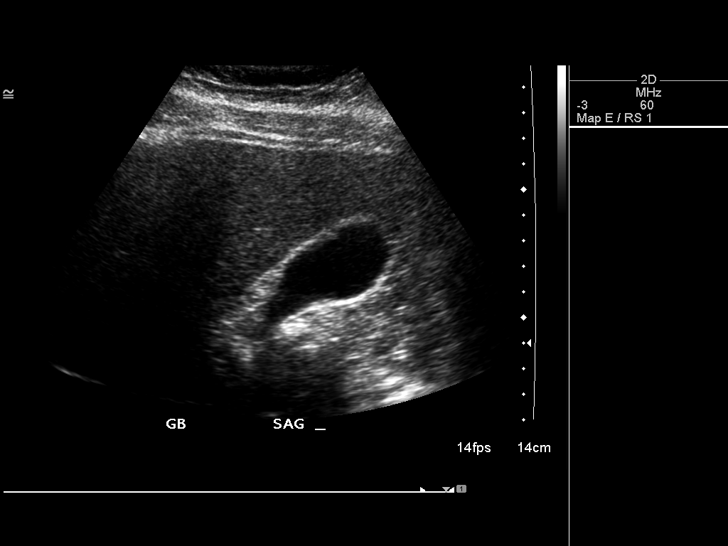
[im 56/84]
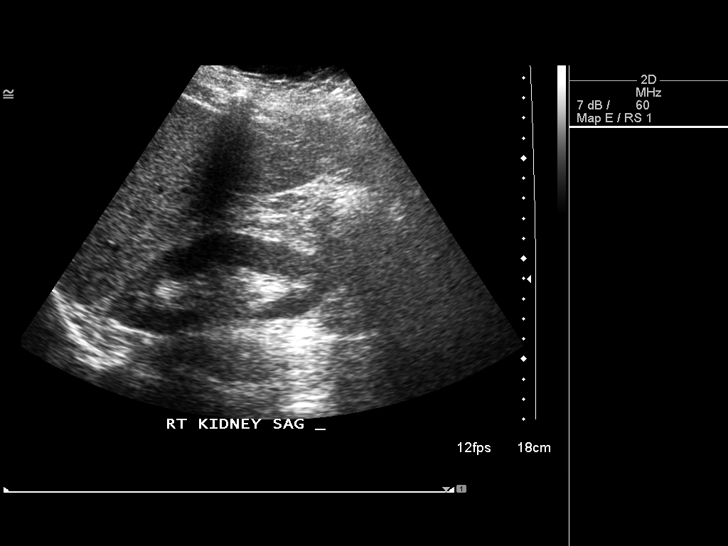
[im 63/84]
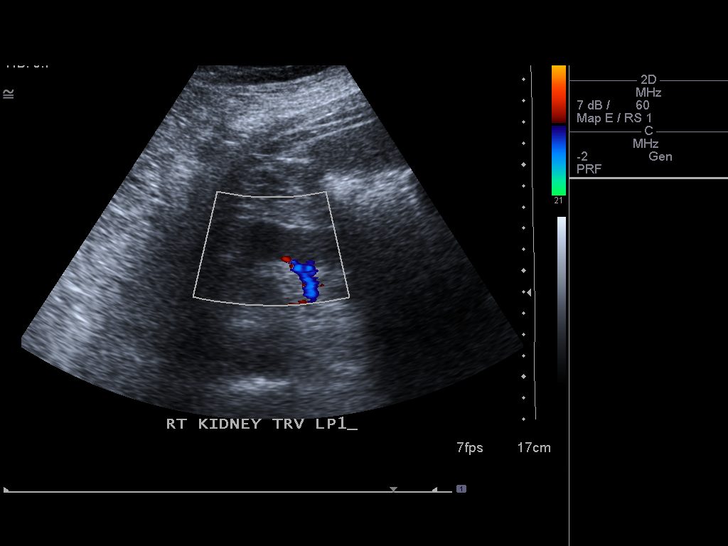
[im 70/84]
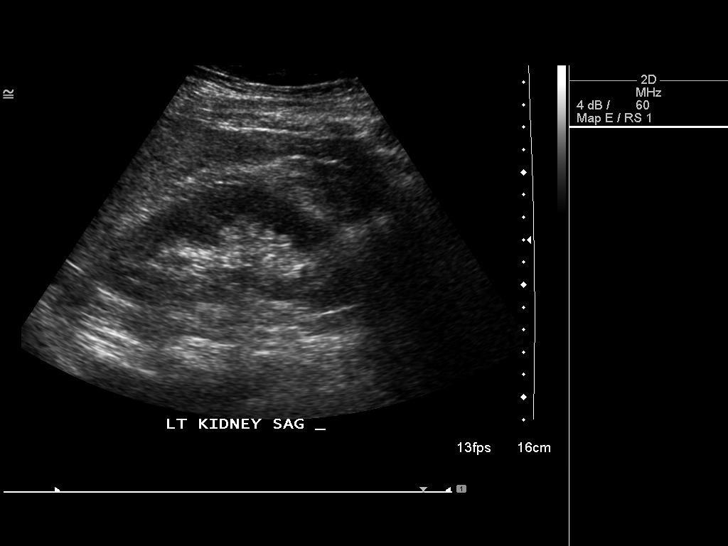
[im 77/84]
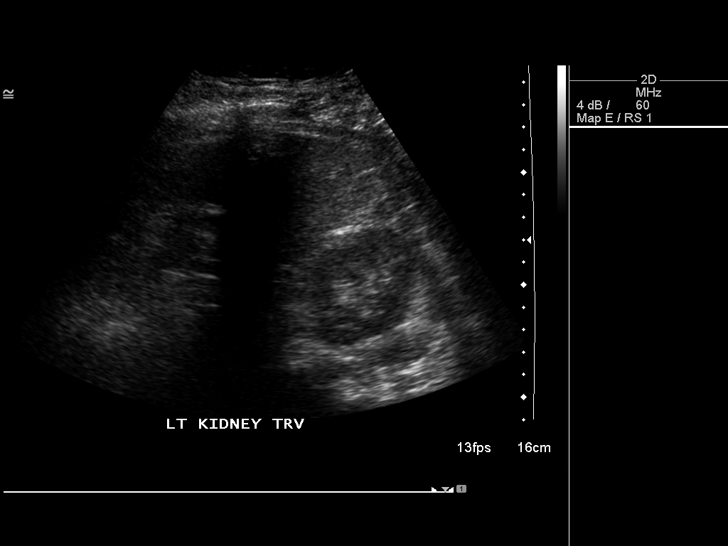
[im 84/84]
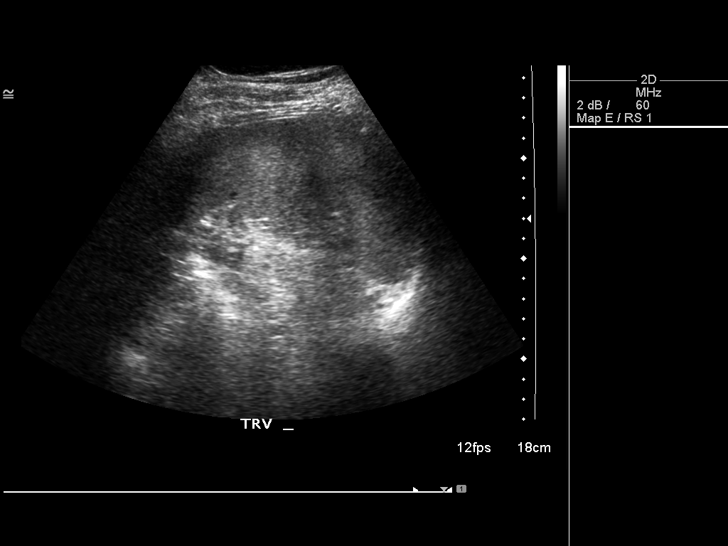

[13 of 25 positions shown; findings below may reference images not displayed]

FINDINGS: Gallbladder:

The gallbladder is visualized and no gallstones are noted. There is
no pain over the gallbladder with compression.

Common bile duct:

Diameter: The common bile duct is normal measuring 2.9 mm in
diameter.

Liver:

The liver has a normal echogenic pattern. No focal abnormality is
seen.

IVC:

No abnormality visualized.

Pancreas:

The pancreas is largely obscured by bowel gas.

Spleen:

The spleen is normal measuring 12.2 cm sagittally.

Right Kidney:

Length: 10.7 cm.. No hydronephrosis is seen. A hypoechoic structure
is noted in the lower pole measuring 2.2 x 2.0 x 2.2 cm most
consistent with small slightly complex renal cyst.

Left Kidney:

Length: 12.2 cm..  No hydronephrosis is seen.

Abdominal aorta:

The abdominal aorta is partially obscured by bowel gas.

Other findings:

Some of the anatomy is obscured by overlying bowel gas.
IMPRESSION: 1. The area questioned in the lower pole of the right kidney appears
most typical of a 2.2 cm right lower pole slightly complex renal
cyst by ultrasound. No solid renal lesion is seen. Consider followup
ultrasound in 6 months.
2. No gallstones.
3. Portions of the anatomy such as the pancreas and abdominal aorta
are obscured by bowel gas.

## 2015-10-23 IMAGING — US US ART/VEN ABD/PELV/SCROTUM DOPPLER LTD
1 series · 13 of 25 positions shown · non-contrast
Comparison: None

CLINICAL DATA: RIGHT testicular pain radiating to RIGHT lower
quadrant for 24 hours after moving heavy object at work, pain worse
with palpation, no pain with urination, RIGHT scrotal swelling
beginning last night, history hypertension

EXAM:
SCROTAL ULTRASOUND
DOPPLER ULTRASOUND OF THE TESTICLES
TECHNIQUE: Complete ultrasound examination of the testicles, epididymis, and
other scrotal structures was performed. Color and spectral Doppler
ultrasound were also utilized to evaluate blood flow to the
testicles.

[Series 1: us art/ven abd/pelv/scrotum doppler ltd · 0.07mm/px · 13 of 71 slices shown]
[im 1/71]
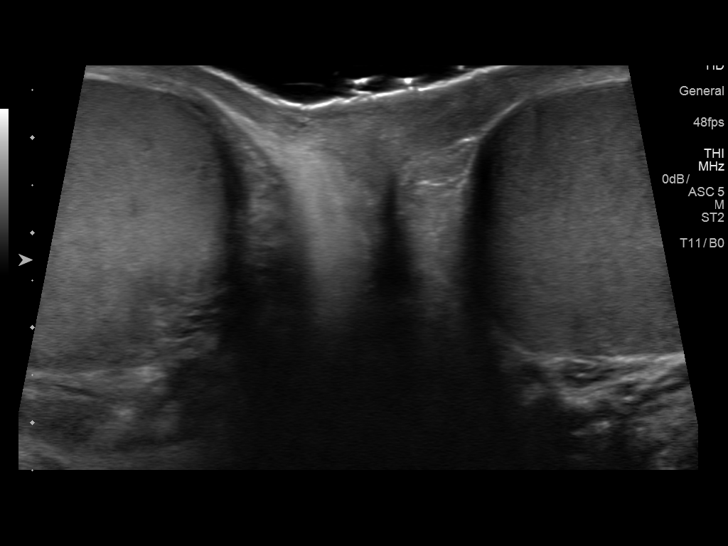
[im 6/71]
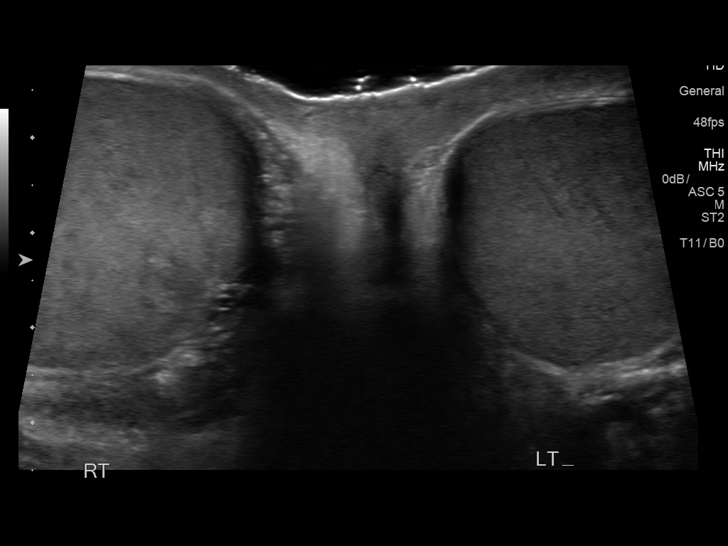
[im 12/71]
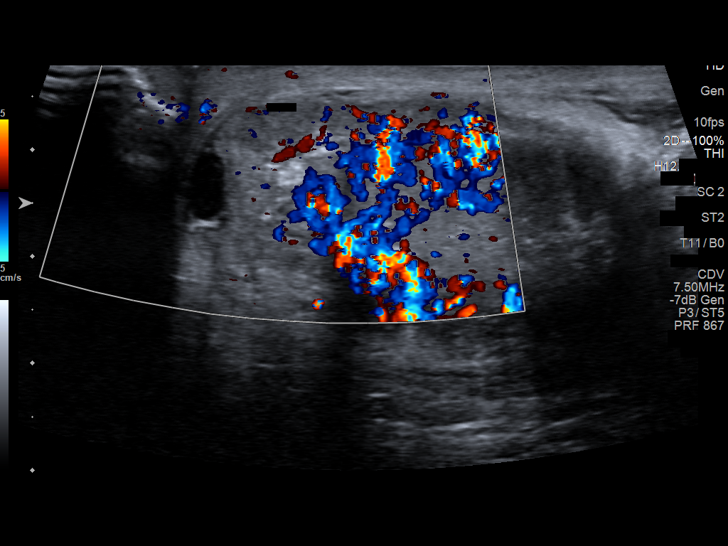
[im 18/71]
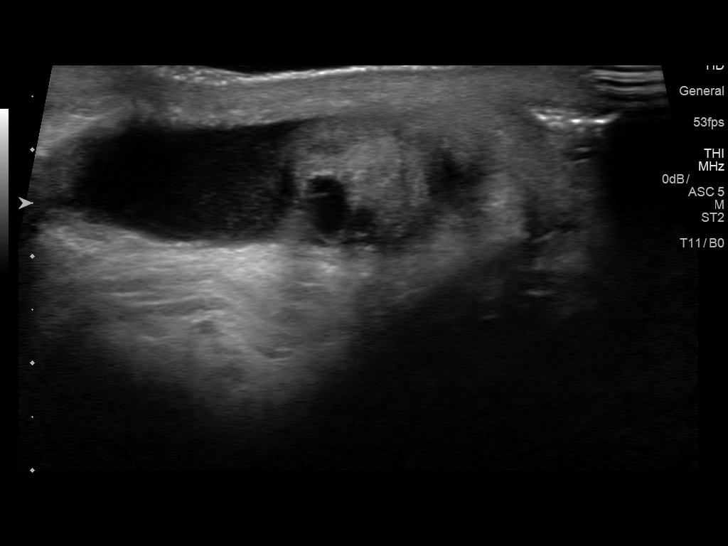
[im 24/71]
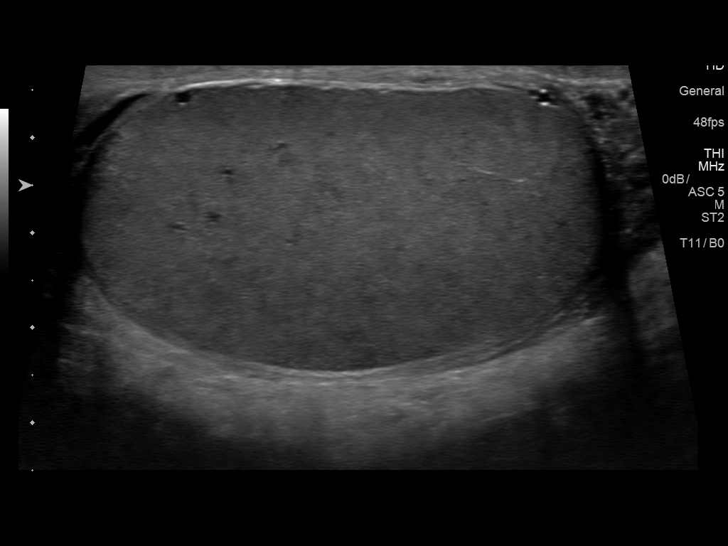
[im 30/71]
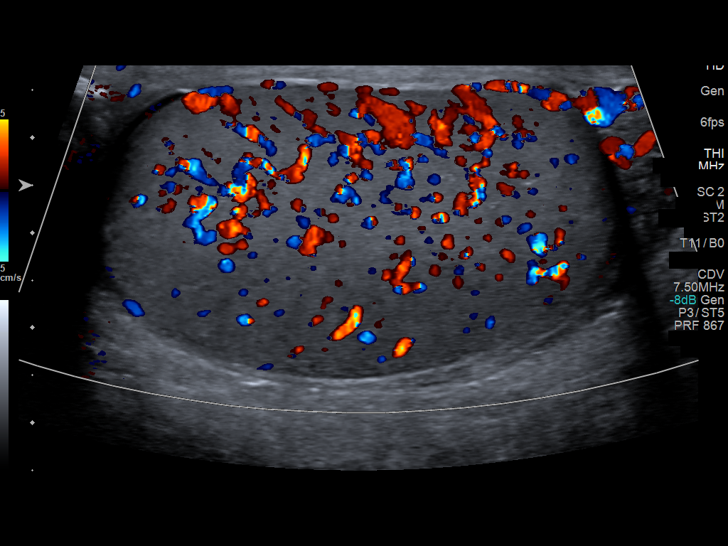
[im 36/71]
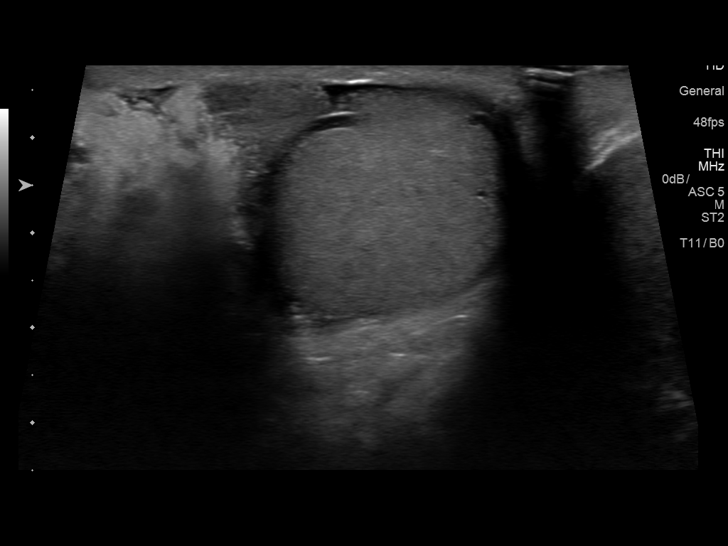
[im 41/71]
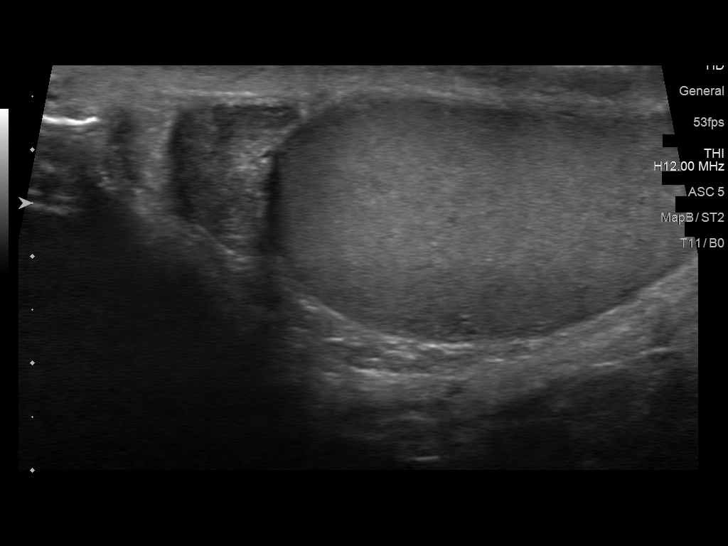
[im 47/71]
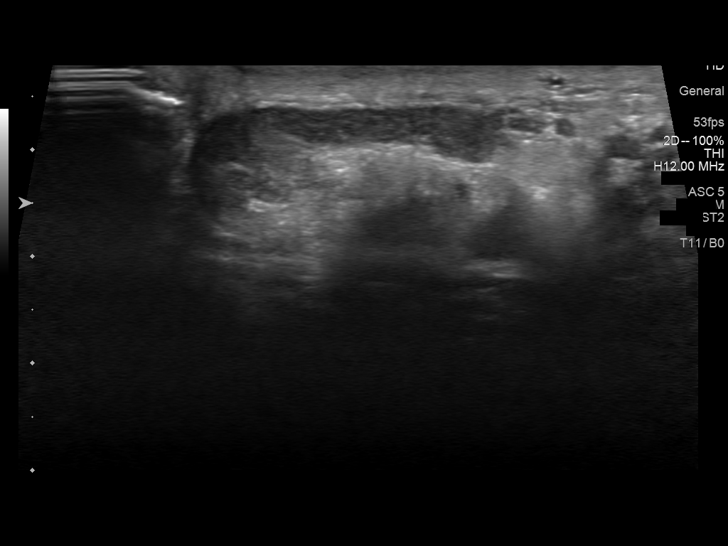
[im 53/71]
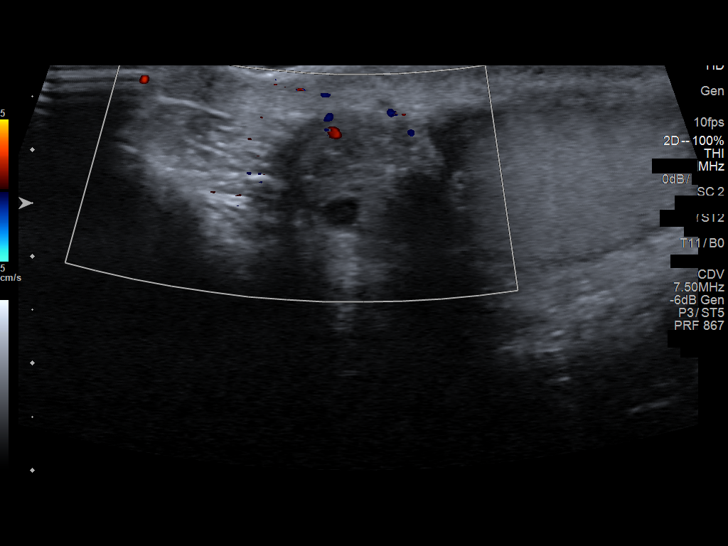
[im 59/71]
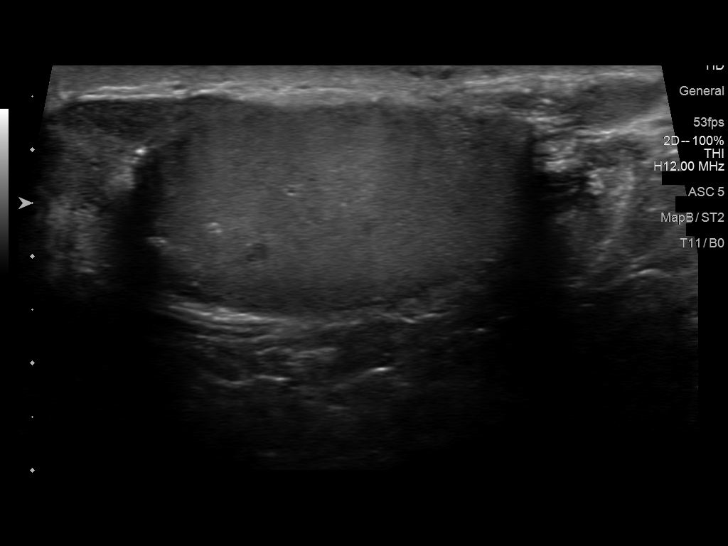
[im 65/71]
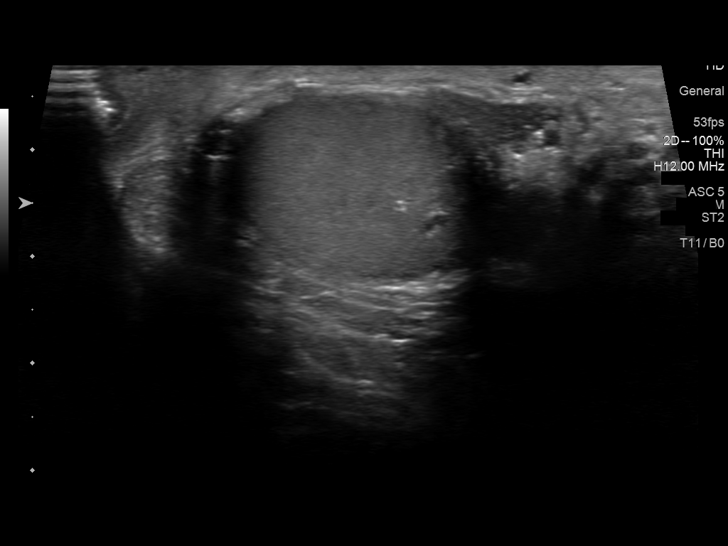
[im 71/71]
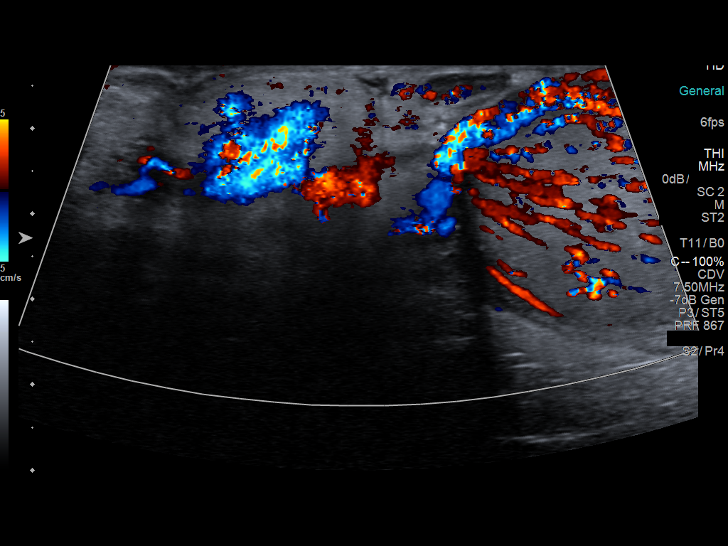

[13 of 25 positions shown; findings below may reference images not displayed]

FINDINGS: Right testicle

Measurements: 5.6 x 3.0 x 3.7 cm. Homogeneous echogenicity without
mass or calcification. Hypervascular appearance on color Doppler
imaging.

Left testicle

Measurements: 5.6 x 2.2 x 3.5 cm. Normal morphology without mass or
calcification. Internal blood flow present on color Doppler imaging.

Right epididymis: Enlarged and heterogeneous with increased blood
flow on color Doppler imaging compatible with inflammatory process.
Anechoic focus at head compatible with cyst 7 x 4 x 5 mm.

Left epididymis: Normal echogenicity. Tiny cyst at head 4 x 4 x 3
mm.

Hydrocele:  Small RIGHT hydrocele.  No LEFT hydrocele.

Varicocele:  None visualized.

Pulsed Doppler interrogation of both testes demonstrates normal low
resistance arterial and venous waveforms bilaterally.

Prominent spermatic cord on RIGHT which appears heterogeneous with
echogenic areas likely representing fat. Hypervascularity on color
Doppler imaging. No definite peristalsis on real-time imaging to
suggest bowel.
IMPRESSION: Hypervascular RIGHT testis, epididymis, and spermatic cord
compatible with epididymo-orchitis.

Suspicion of RIGHT inguinal hernia; recommend correlation with
physical exam

## 2016-01-03 ENCOUNTER — Emergency Department (INDEPENDENT_AMBULATORY_CARE_PROVIDER_SITE_OTHER)
Admission: EM | Admit: 2016-01-03 | Discharge: 2016-01-03 | Disposition: A | Discharge status: Home / Self Care | Payer: BLUE CROSS/BLUE SHIELD | Source: Home / Self Care | Attending: Family Medicine | Admitting: Family Medicine

## 2016-01-03 ENCOUNTER — Encounter: Payer: Self-pay | Admitting: *Deleted

## 2016-01-03 DIAGNOSIS — R69 Illness, unspecified: Secondary | ICD-10-CM | POA: Diagnosis not present

## 2016-01-03 DIAGNOSIS — J111 Influenza due to unidentified influenza virus with other respiratory manifestations: Secondary | ICD-10-CM

## 2016-01-03 MED ORDER — OSELTAMIVIR PHOSPHATE 75 MG PO CAPS
75.0000 mg | ORAL_CAPSULE | Freq: Two times a day (BID) | ORAL | 0 refills | Status: DC
Start: 2016-01-03 — End: 2016-01-15

## 2016-01-03 NOTE — ED Provider Notes (Addendum)
[facility]    CSN: [id number] Arrival date & time: 01/03/16  0916     History   Chief Complaint Chief Complaint  Patient presents with  . Generalized Body Aches    HPI Louis Phillips is a 36 y.o. male.   Complains of 2 day history flu-like illness including myalgias, chills, fatigue, sore throat, and nasal congestion.  No cough.   The history is provided by the patient.    Past Medical History:  Diagnosis Date  . Anxiety   . Hypertension     Patient Active Problem List   Diagnosis Date Noted  . OSA (obstructive sleep apnea) 08/29/2015  . Obesity 07/29/2013  . HYPERLIPIDEMIA 10/08/2010  . Essential hypertension, benign 05/26/2010  . Depressive disorder, not elsewhere classified 05/26/2010  . BACK PAIN, LUMBAR 02/04/2009  . CARPAL TUNNEL SYNDROME, LEFT 03/09/2008  . ANXIETY 11/14/2007    Past Surgical History:  Procedure Laterality Date  . BACK SURGERY    . TONSILLECTOMY  36 yrs old       Home Medications    Prior to Admission medications   Medication Sig Start Date End Date Taking? Authorizing Provider  enalapril (VASOTEC) 20 MG tablet TAKE 1 TABLET (20 MG TOTAL) BY MOUTH DAILY. 08/29/15  Yes Agapito Gamesatherine D Metheney, MD  AMBULATORY NON FORMULARY MEDICATION Medication Name CPAP set to autopap - setting between 2-20.  Dx OSA.  CPAP, humidifier and suppleies Patient not taking: Reported on 08/29/2015 03/17/15   Agapito Gamesatherine D Metheney, MD  oseltamivir (TAMIFLU) 75 MG capsule Take 1 capsule (75 mg total) by mouth every 12 (twelve) hours. 01/03/16   Louis HawStephen A Beese, MD  Salicylic Acid 40 % PADS Apply 1 pad to sore spot, change every 2 days 09/09/15   Junius FinnerErin O'Malley, PA-C    Family History Family History  Problem Relation Age of Onset  . Diabetes Father   . Hypertension Father   . Hyperlipidemia Father   . Colon cancer Father [id number]  . Rheum arthritis Father   . Diabetes Mother   . Hyperlipidemia Mother   . Hypertension Mother   . Other Maternal  Grandmother 55    AMI  . Heart disease Maternal Grandmother [id number]    AMI    Social History Social History  Substance Use Topics  . Smoking status: Never Smoker  . Smokeless tobacco: Never Used  . Alcohol use Yes     Comment: socially     Allergies   Patient has no known allergies.   Review of Systems Review of Systems + sore throat No cough No pleuritic pain No wheezing + nasal congestion + post-nasal drainage No sinus pain/pressure No itchy/red eyes No earache No hemoptysis No SOB No fever, + chills No nausea No vomiting No abdominal pain No diarrhea No urinary symptoms No skin rash + fatigue + myalgias No headache Used OTC meds without relief   Physical Exam Triage Vital Signs ED Triage Vitals  Enc Vitals Group     BP 01/03/16 0948 126/82     Pulse Rate 01/03/16 0948 77     Resp 01/03/16 0948 18     Temp 01/03/16 0948 98.2 F (36.8 C)     Temp Source 01/03/16 0948 Oral     SpO2 01/03/16 0948 98 %     Weight 01/03/16 0949 248 lb (112.5 kg)     Height 01/03/16 0949 6' (1.829 m)     Head Circumference --      Peak Flow --  Pain Score 01/03/16 0950 0     Pain Loc --      Pain Edu? --      Excl. in GC? --    No data found.   Updated Vital Signs BP 126/82 (BP Location: Left Arm)   Pulse 77   Temp 98.2 F (36.8 C) (Oral)   Resp 18   Ht 6' (1.829 m)   Wt 248 lb (112.5 kg)   SpO2 98%   BMI 33.63 kg/m   Visual Acuity Right Eye Distance:   Left Eye Distance:   Bilateral Distance:    Right Eye Near:   Left Eye Near:    Bilateral Near:     Physical Exam Nursing notes and Vital Signs reviewed. Appearance:  Patient appears stated age, and in no acute distress Eyes:  Pupils are equal, round, and reactive to light and accomodation.  Extraocular movement is intact.  Conjunctivae are not inflamed  Ears:  Canals normal.  Tympanic membranes normal.  Nose:  Mildly congested turbinates.  No sinus tenderness.   Pharynx:  Normal Neck:  Supple.   Tender enlarged posterior/lateral nodes are palpated bilaterally  Lungs:  Clear to auscultation.  Breath sounds are equal.  Moving air well. Heart:  Regular rate and rhythm without murmurs, rubs, or gallops.  Abdomen:  Nontender without masses or hepatosplenomegaly.  Bowel sounds are present.  No CVA or flank tenderness.  Extremities:  No edema.  Skin:  No rash present.    UC Treatments / Results  Labs (all labs ordered are listed, but only abnormal results are displayed) Labs Reviewed - No data to display  EKG  EKG Interpretation None       Radiology No results found.  Procedures Procedures (including critical care time)  Medications Ordered in UC Medications - No data to display   Initial Impression / Assessment and Plan / UC Course  I have reviewed the triage vital signs and the nursing notes.  Pertinent labs & imaging results that were available during my care of the patient were reviewed by me and considered in my medical decision making (see chart for details).  Clinical Course   Begin Tamiflu. Take plain guaifenesin (1200mg  extended release tabs such as Mucinex) twice daily, with plenty of water, for cough and congestion.  May add Pseudoephedrine (30mg , one or two every 4 to 6 hours) for sinus congestion.  Get adequate rest.   May use Afrin nasal spray (or generic oxymetazoline) twice daily for about 5 days and then discontinue.  Also recommend using saline nasal spray several times daily and saline nasal irrigation (AYR is a common brand).  Use Flonase nasal spray each morning after using Afrin nasal spray and saline nasal irrigation. Try warm salt water gargles for sore throat.  Stop all antihistamines for now, and other non-prescription cough/cold preparations. May take Ibuprofen 200mg , 4 tabs every 8 hours with food for body aches, headache, etc. May take Delsym Cough Suppressant at bedtime for nighttime cough.  Followup with Family Doctor if not improved in one  week.      Final Clinical Impressions(s) / UC Diagnoses   Final diagnoses:  Influenza-like illness    New Prescriptions New Prescriptions   OSELTAMIVIR (TAMIFLU) 75 MG CAPSULE    Take 1 capsule (75 mg total) by mouth every 12 (twelve) hours.     Louis Haw, MD 01/23/16 7829    Louis Haw, MD 01/23/16 2151

## 2016-01-03 NOTE — ED Triage Notes (Signed)
Pt c/o lymph node tenderness in his neck, body aches, and runny nose x 2- 3 days.

## 2016-01-03 NOTE — Discharge Instructions (Signed)
Take plain guaifenesin (1200mg extended release tabs such as Mucinex) twice daily, with plenty of water, for cough and congestion.  May add Pseudoephedrine (30mg, one or two every 4 to 6 hours) for sinus congestion.  Get adequate rest.   °May use Afrin nasal spray (or generic oxymetazoline) twice daily for about 5 days and then discontinue.  Also recommend using saline nasal spray several times daily and saline nasal irrigation (AYR is a common brand).  Use Flonase nasal spray each morning after using Afrin nasal spray and saline nasal irrigation. °Try warm salt water gargles for sore throat.  °Stop all antihistamines for now, and other non-prescription cough/cold preparations. °May take Ibuprofen 200mg, 4 tabs every 8 hours with food for body aches, headache, etc. °May take Delsym Cough Suppressant at bedtime for nighttime cough.  °  °

## 2016-01-15 ENCOUNTER — Encounter: Payer: Self-pay | Admitting: Emergency Medicine

## 2016-01-15 ENCOUNTER — Emergency Department (INDEPENDENT_AMBULATORY_CARE_PROVIDER_SITE_OTHER)
Admission: EM | Admit: 2016-01-15 | Discharge: 2016-01-15 | Disposition: A | Discharge status: Home / Self Care | Payer: BLUE CROSS/BLUE SHIELD | Source: Home / Self Care | Attending: Family Medicine | Admitting: Family Medicine

## 2016-01-15 DIAGNOSIS — J029 Acute pharyngitis, unspecified: Secondary | ICD-10-CM

## 2016-01-15 DIAGNOSIS — R0981 Nasal congestion: Secondary | ICD-10-CM | POA: Diagnosis not present

## 2016-01-15 LAB — POCT RAPID STREP A (OFFICE): Rapid Strep A Screen: NEGATIVE

## 2016-01-15 NOTE — ED Triage Notes (Signed)
Pt c/o sore throat x 2 days, fever this am of 101, no exposure, lymph nodes swollen, ears w/fluid.

## 2016-01-15 NOTE — ED Provider Notes (Signed)
CSN: [id number]     Arrival date & time 01/15/16  1326 History   First MD Initiated Contact with Patient 01/15/16 1411     Chief Complaint  Patient presents with  . Sore Throat   (Consider location/radiation/quality/duration/timing/severity/associated sxs/prior Treatment) HPI  Louis Phillips is a 36 y.o. male presenting to UC with c/o sore throat for 2 days with fever of 101*F this morning.  Associated sensation of fluid on his ears. He was treated with Tamiflu about 1 week ago, symptoms resolved but then sore throat started.  No known exposure to sick contacts. Denies n/v/d. Denies cough but has had mild congestion.   Past Medical History:  Diagnosis Date  . Anxiety   . Hypertension    Past Surgical History:  Procedure Laterality Date  . BACK SURGERY    . TONSILLECTOMY  36 yrs old   Family History  Problem Relation Age of Onset  . Diabetes Father   . Hypertension Father   . Hyperlipidemia Father   . Colon cancer Father 44  . Rheum arthritis Father   . Diabetes Mother   . Hyperlipidemia Mother   . Hypertension Mother   . Other Maternal Grandmother 55    AMI  . Heart disease Maternal Grandmother 9    AMI   Social History  Substance Use Topics  . Smoking status: Never Smoker  . Smokeless tobacco: Never Used  . Alcohol use Yes     Comment: socially    Review of Systems  Constitutional: Positive for fever. Negative for chills.  HENT: Positive for congestion and sore throat. Negative for ear pain, trouble swallowing and voice change.   Respiratory: Negative for cough and shortness of breath.   Cardiovascular: Negative for chest pain and palpitations.  Gastrointestinal: Negative for abdominal pain, diarrhea, nausea and vomiting.  Musculoskeletal: Negative for arthralgias, back pain and myalgias.  Skin: Negative for rash.    Allergies  Patient has no known allergies.  Home Medications   Prior to Admission medications   Medication Sig Start Date End Date Taking?  Authorizing Provider  enalapril (VASOTEC) 20 MG tablet TAKE 1 TABLET (20 MG TOTAL) BY MOUTH DAILY. 08/29/15   Agapito Games, MD   Meds Ordered and Administered this Visit  Medications - No data to display  BP 127/83 (BP Location: Left Arm)   Pulse 93   Temp 98.7 F (37.1 C) (Oral)   Ht 6' (1.829 m)   Wt 249 lb 4 oz (113.1 kg)   SpO2 97%   BMI 33.80 kg/m  No data found.   Physical Exam  Constitutional: He is oriented to person, place, and time. He appears well-developed and well-nourished. No distress.  HENT:  Head: Normocephalic and atraumatic.  Right Ear: Tympanic membrane normal.  Left Ear: Tympanic membrane normal.  Nose: Nose normal.  Mouth/Throat: Uvula is midline and mucous membranes are normal. Posterior oropharyngeal erythema present. No oropharyngeal exudate, posterior oropharyngeal edema or tonsillar abscesses.  Eyes: EOM are normal.  Neck: Normal range of motion. Neck supple.  Cardiovascular: Normal rate and regular rhythm.   Pulmonary/Chest: Effort normal and breath sounds normal. No stridor. No respiratory distress. He has no wheezes. He has no rales.  Musculoskeletal: Normal range of motion.  Lymphadenopathy:    He has no cervical adenopathy.  Neurological: He is alert and oriented to person, place, and time.  Skin: Skin is warm and dry. He is not diaphoretic.  Psychiatric: He has a normal mood and affect. His behavior  is normal.  Nursing note and vitals reviewed.   Urgent Care Course   Clinical Course     Procedures (including critical care time)  Labs Review Labs Reviewed  STREP A DNA PROBE  POCT RAPID STREP A (OFFICE)    Imaging Review No results found.    MDM   1. Pharyngitis, unspecified etiology    Pt c/o 2 days of sore throat and fever. Recently tx with Tamiflu. No sick contacts.  Rapid strep: Negative Will send culture.  F/u with PCP in 1 week if not improving. Patient verbalized understanding and agreement with treatment  plan. r    Junius FinnerErin O'Malley, PA-C 01/15/16 1531

## 2016-01-15 NOTE — Discharge Instructions (Signed)

## 2016-01-16 ENCOUNTER — Telehealth: Payer: Self-pay | Admitting: Emergency Medicine

## 2016-01-16 LAB — STREP A DNA PROBE: GASP: NOT DETECTED

## 2016-01-21 ENCOUNTER — Emergency Department (INDEPENDENT_AMBULATORY_CARE_PROVIDER_SITE_OTHER): Payer: BLUE CROSS/BLUE SHIELD

## 2016-01-21 ENCOUNTER — Encounter: Payer: Self-pay | Admitting: Emergency Medicine

## 2016-01-21 ENCOUNTER — Emergency Department (INDEPENDENT_AMBULATORY_CARE_PROVIDER_SITE_OTHER)
Admission: EM | Admit: 2016-01-21 | Discharge: 2016-01-21 | Disposition: A | Discharge status: Home / Self Care | Payer: BLUE CROSS/BLUE SHIELD | Source: Home / Self Care | Attending: Family Medicine | Admitting: Family Medicine

## 2016-01-21 DIAGNOSIS — R51 Headache: Secondary | ICD-10-CM | POA: Diagnosis not present

## 2016-01-21 DIAGNOSIS — R0981 Nasal congestion: Secondary | ICD-10-CM | POA: Diagnosis not present

## 2016-01-21 MED ORDER — AMOXICILLIN-POT CLAVULANATE 875-125 MG PO TABS: 1.0000 | ORAL_TABLET | Freq: Two times a day (BID) | ORAL | 0 refills | Status: DC

## 2016-01-21 MED ORDER — PREDNISONE 20 MG PO TABS: ORAL_TABLET | ORAL | 0 refills | Status: DC

## 2016-01-21 NOTE — ED Provider Notes (Signed)
[facility]    CSN: [id number] Arrival date & time: 01/21/16  0906     History   Chief Complaint Chief Complaint  Patient presents with  . Nasal Congestion  . Conjunctivitis  . Cough  . Sore Throat    HPI Louis Phillips is a 36 y.o. male.   Patient has been treated for a URI twice within the past month.  He reports persistent facial discomfort/pressure, nasal congestion, itchy eyes, low grade fever, sore throat, and minimal cough.   The history is provided by the patient.    Past Medical History:  Diagnosis Date  . Anxiety   . Hypertension     Patient Active Problem List   Diagnosis Date Noted  . OSA (obstructive sleep apnea) 08/29/2015  . Obesity 07/29/2013  . HYPERLIPIDEMIA 10/08/2010  . Essential hypertension, benign 05/26/2010  . Depressive disorder, not elsewhere classified 05/26/2010  . BACK PAIN, LUMBAR 02/04/2009  . CARPAL TUNNEL SYNDROME, LEFT 03/09/2008  . ANXIETY 11/14/2007    Past Surgical History:  Procedure Laterality Date  . BACK SURGERY    . TONSILLECTOMY  36 yrs old       Home Medications    Prior to Admission medications   Medication Sig Start Date End Date Taking? Authorizing Provider  amoxicillin-clavulanate (AUGMENTIN) 875-125 MG tablet Take 1 tablet by mouth 2 (two) times daily. Take with food 01/21/16   Lattie Haw, MD  enalapril (VASOTEC) 20 MG tablet TAKE 1 TABLET (20 MG TOTAL) BY MOUTH DAILY. 08/29/15   Agapito Games, MD  predniSONE (DELTASONE) 20 MG tablet Take one tab by mouth twice daily for 5 days, then one daily for 3 days. Take with food. 01/21/16   Lattie Haw, MD    Family History Family History  Problem Relation Age of Onset  . Diabetes Father   . Hypertension Father   . Hyperlipidemia Father   . Colon cancer Father 51  . Rheum arthritis Father   . Diabetes Mother   . Hyperlipidemia Mother   . Hypertension Mother   . Other Maternal Grandmother 55    AMI  . Heart disease Maternal  Grandmother 62    AMI    Social History Social History  Substance Use Topics  . Smoking status: Never Smoker  . Smokeless tobacco: Never Used  . Alcohol use Yes     Comment: socially     Allergies   Patient has no known allergies.   Review of Systems Review of Systems  + sore throat + cough No pleuritic pain No wheezing + nasal congestion + post-nasal drainage + sinus pain/pressure + itchy/red eyes No earache No hemoptysis No SOB + fever, + chills No nausea No vomiting No abdominal pain No diarrhea No urinary symptoms No skin rash + fatigue No myalgias No headache Used OTC meds without relief    Physical Exam Triage Vital Signs ED Triage Vitals  Enc Vitals Group     BP 01/21/16 0918 132/87     Pulse Rate 01/21/16 0918 75     Resp 01/21/16 0918 16     Temp 01/21/16 0918 98.3 F (36.8 C)     Temp Source 01/21/16 0918 Oral     SpO2 01/21/16 0918 98 %     Weight --      Height --      Head Circumference --      Peak Flow --      Pain Score 01/21/16 0920 0  Pain Loc --      Pain Edu? --      Excl. in GC? --    No data found.   Updated Vital Signs BP 132/87 (BP Location: Left Arm)   Pulse 75   Temp 98.3 F (36.8 C) (Oral)   Resp 16   SpO2 98%   Visual Acuity Right Eye Distance:   Left Eye Distance:   Bilateral Distance:    Right Eye Near:   Left Eye Near:    Bilateral Near:     Physical Exam Nursing notes and Vital Signs reviewed. Appearance:  Patient appears stated age, and in no acute distress Eyes:  Pupils are equal, round, and reactive to light and accomodation.  Extraocular movement is intact.  Conjunctivae are mildly injected. Ears:  Canals normal.  Tympanic membranes normal.  Nose:  Congested turbinates.  Maxillary sinus tenderness is present.  Pharynx:  Normal Neck:  Supple.  Tender enlarged posterior/lateral nodes are palpated bilaterally  Lungs:  Clear to auscultation.  Breath sounds are equal.  Moving air  well. Heart:  Regular rate and rhythm without murmurs, rubs, or gallops.  Abdomen:  Nontender without masses or hepatosplenomegaly.  Bowel sounds are present.  No CVA or flank tenderness.  Extremities:  No edema.  Skin:  No rash present.    UC Treatments / Results  Labs (all labs ordered are listed, but only abnormal results are displayed) Labs Reviewed - No data to display  EKG  EKG Interpretation None       Radiology Dg Sinuses Complete  Result Date: 01/21/2016 CLINICAL DATA:  Nasal congestion, facial pain EXAM: PARANASAL SINUSES - COMPLETE 3 + VIEW COMPARISON:  None. FINDINGS: The paranasal sinus are aerated. There is no evidence of sinus opacification air-fluid levels or mucosal thickening. No significant bone abnormalities are seen. IMPRESSION: Negative. Electronically Signed   By: Elige KoHetal  Patel   On: 01/21/2016 09:56    Procedures Procedures (including critical care time)  Medications Ordered in UC Medications - No data to display   Initial Impression / Assessment and Plan / UC Course  I have reviewed the triage vital signs and the nursing notes.  Pertinent labs & imaging results that were available during my care of the patient were reviewed by me and considered in my medical decision making (see chart for details).    Begin amoxicillin and prednisone burst/taper. May continue plain guaifenesin (1200mg  extended release tabs such as Mucinex) twice daily, with plenty of water, for cough and congestion.  May add Pseudoephedrine (30mg , one or two every 4 to 6 hours) for sinus congestion (if blood pressure is not affected).  Get adequate rest.   May use Afrin nasal spray (or generic oxymetazoline) twice daily for about 5 days and then discontinue.  Also recommend using saline nasal spray several times daily and saline nasal irrigation (AYR is a common brand).  Use Flonase nasal spray each morning after using Afrin nasal spray and saline nasal irrigation. Try warm salt water  gargles for sore throat.  Stop all antihistamines for now, and other non-prescription cough/cold preparations. Followup with Family Doctor if not improved in one week.     Final Clinical Impressions(s) / UC Diagnoses   Final diagnoses:  Nasal congestion    New Prescriptions New Prescriptions   AMOXICILLIN-CLAVULANATE (AUGMENTIN) 875-125 MG TABLET    Take 1 tablet by mouth 2 (two) times daily. Take with food   PREDNISONE (DELTASONE) 20 MG TABLET    Take one tab  by mouth twice daily for 5 days, then one daily for 3 days. Take with food.     Lattie Haw, MD 01/28/16 2023

## 2016-01-21 NOTE — ED Triage Notes (Signed)
Patient here for third visit this month; similar symptoms of congestion, cough, sore throat, and now conjunctivitis. No OTC since last night.

## 2016-01-21 NOTE — Discharge Instructions (Signed)
May continue plain guaifenesin (1200mg  extended release tabs such as Mucinex) twice daily, with plenty of water, for cough and congestion.  May add Pseudoephedrine (30mg , one or two every 4 to 6 hours) for sinus congestion (if blood pressure is not affected).  Get adequate rest.   May use Afrin nasal spray (or generic oxymetazoline) twice daily for about 5 days and then discontinue.  Also recommend using saline nasal spray several times daily and saline nasal irrigation (AYR is a common brand).  Use Flonase nasal spray each morning after using Afrin nasal spray and saline nasal irrigation. Try warm salt water gargles for sore throat.  Stop all antihistamines for now, and other non-prescription cough/cold preparations.

## 2016-02-27 ENCOUNTER — Other Ambulatory Visit: Payer: Self-pay | Admitting: Family Medicine

## 2016-03-01 ENCOUNTER — Ambulatory Visit: Payer: BLUE CROSS/BLUE SHIELD | Admitting: Family Medicine

## 2016-03-08 ENCOUNTER — Ambulatory Visit (INDEPENDENT_AMBULATORY_CARE_PROVIDER_SITE_OTHER): Payer: BLUE CROSS/BLUE SHIELD | Admitting: Family Medicine

## 2016-03-08 ENCOUNTER — Encounter: Payer: Self-pay | Admitting: Family Medicine

## 2016-03-08 VITALS — BP 105/63 | HR 74 | Ht 72.0 in | Wt 250.0 lb

## 2016-03-08 DIAGNOSIS — I10 Essential (primary) hypertension: Secondary | ICD-10-CM

## 2016-03-08 DIAGNOSIS — R635 Abnormal weight gain: Secondary | ICD-10-CM

## 2016-03-08 MED ORDER — PHENTERMINE HCL 37.5 MG PO TABS: 37.5000 mg | ORAL_TABLET | Freq: Every day | ORAL | 0 refills | Status: DC

## 2016-03-08 NOTE — Progress Notes (Signed)
Subjective:    CC: HTN  HPI: Hypertension- Pt denies chest pain, SOB, dizziness, or heart palpitations.  Taking meds as directed w/o problems.  Denies medication side effects.    Abnormal weight gain-he is interested in trying to lose weight again. He is pretty slim phentermine and did well without any side effects. He would like to start it again. His weight loss goal will be to get down to 220 pounds. He is currently exercising 3-4 times per week. He does not drink any soda or juice and gave up sweet tea. He's tried to work on portion control as well.  Past medical history, Surgical history, Family history not pertinant except as noted below, Social history, Allergies, and medications have been entered into the medical record, reviewed, and corrections made.   Review of Systems: No fevers, chills, night sweats, weight loss, chest pain, or shortness of breath.   Objective:    General: Well Developed, well nourished, and in no acute distress.  Neuro: Alert and oriented x3, extra-ocular muscles intact, sensation grossly intact.  HEENT: Normocephalic, atraumatic  Skin: Warm and dry, no rashes. Cardiac: Regular rate and rhythm, no murmurs rubs or gallops, no lower extremity edema.  Respiratory: Clear to auscultation bilaterally. Not using accessory muscles, speaking in full sentences.   Impression and Recommendations:   HTN - Well controlled. Continue current regimen. Follow-up in 6 months will be due for full lab work at that time.   Abnormal weight gain - restart phentermine. He is taken before and done well with it. Encouraged him to make sure he's working on portion control and might even want to start doing some calorie counting with a smart phone application. Continue with regular exercise and follow-up in 6-8 weeks for nurse visit.

## 2016-03-15 ENCOUNTER — Ambulatory Visit: Payer: BLUE CROSS/BLUE SHIELD

## 2016-03-23 ENCOUNTER — Ambulatory Visit (INDEPENDENT_AMBULATORY_CARE_PROVIDER_SITE_OTHER): Payer: BLUE CROSS/BLUE SHIELD | Admitting: Family Medicine

## 2016-03-23 VITALS — BP 134/80 | HR 76 | Ht 72.0 in | Wt 250.1 lb

## 2016-03-23 DIAGNOSIS — R635 Abnormal weight gain: Secondary | ICD-10-CM

## 2016-03-23 DIAGNOSIS — Z6834 Body mass index (BMI) 34.0-34.9, adult: Secondary | ICD-10-CM | POA: Diagnosis not present

## 2016-03-23 MED ORDER — PHENTERMINE HCL 37.5 MG PO TABS: 37.5000 mg | ORAL_TABLET | Freq: Every day | ORAL | 0 refills | Status: DC

## 2016-03-23 NOTE — Progress Notes (Signed)
Abnormal weight gain-it was discussed with patient that he really needs to have a goal of at least 3 pounds the next month to continue with the phentermine since he did not lose weight this time. Encouraged him to start tracking his calories in addition to his exercise that he has started.  Nani Gasseratherine Osamah Schmader, MD

## 2016-03-23 NOTE — Progress Notes (Signed)
   Subjective:    Patient ID: Louis Phillips, male    DOB: 03/28/1980, 36 y.o.   MRN: [id number]  HPI Pt is here for a BP and weight check. Pt denies chest pain, shortness of breath, palpitations, headaches, or any problems with medication.   Review of Systems     Objective:   Physical Exam        Assessment & Plan:  Pt has not lost weight. A refill for phentermine will be faxed to pharmacy per Dr. Linford ArnoldMetheney. Pt advised to schedule a follow-up with nurse in 30 days.

## 2016-04-27 ENCOUNTER — Ambulatory Visit: Payer: BLUE CROSS/BLUE SHIELD

## 2016-07-04 ENCOUNTER — Other Ambulatory Visit: Payer: Self-pay | Admitting: Family Medicine

## 2016-09-05 DIAGNOSIS — F419 Anxiety disorder, unspecified: Secondary | ICD-10-CM | POA: Diagnosis not present

## 2016-09-05 DIAGNOSIS — R229 Localized swelling, mass and lump, unspecified: Secondary | ICD-10-CM | POA: Diagnosis not present

## 2016-09-05 DIAGNOSIS — Z0389 Encounter for observation for other suspected diseases and conditions ruled out: Secondary | ICD-10-CM | POA: Diagnosis not present

## 2016-09-05 DIAGNOSIS — Z79899 Other long term (current) drug therapy: Secondary | ICD-10-CM | POA: Diagnosis not present

## 2016-09-05 DIAGNOSIS — K122 Cellulitis and abscess of mouth: Secondary | ICD-10-CM | POA: Diagnosis not present

## 2016-09-05 DIAGNOSIS — K219 Gastro-esophageal reflux disease without esophagitis: Secondary | ICD-10-CM | POA: Diagnosis not present

## 2016-09-07 DIAGNOSIS — F4323 Adjustment disorder with mixed anxiety and depressed mood: Secondary | ICD-10-CM | POA: Diagnosis not present

## 2016-10-04 ENCOUNTER — Other Ambulatory Visit: Payer: Self-pay | Admitting: Family Medicine

## 2016-10-08 DIAGNOSIS — F4323 Adjustment disorder with mixed anxiety and depressed mood: Secondary | ICD-10-CM | POA: Diagnosis not present

## 2016-11-01 ENCOUNTER — Other Ambulatory Visit: Payer: Self-pay | Admitting: Family Medicine

## 2016-11-19 ENCOUNTER — Telehealth: Payer: Self-pay | Admitting: Family Medicine

## 2016-11-19 NOTE — Telephone Encounter (Signed)
I called pt and left a message stating he is due for a f/u appt with Dr.Matheney and to call our office to schedule a f/u on his BP

## 2016-12-11 ENCOUNTER — Other Ambulatory Visit: Payer: Self-pay | Admitting: Family Medicine

## 2017-01-09 ENCOUNTER — Other Ambulatory Visit: Payer: Self-pay | Admitting: Family Medicine

## 2017-01-26 ENCOUNTER — Other Ambulatory Visit: Payer: Self-pay | Admitting: Family Medicine

## 2017-02-01 ENCOUNTER — Encounter: Payer: Self-pay | Admitting: Family Medicine

## 2017-02-01 ENCOUNTER — Ambulatory Visit (INDEPENDENT_AMBULATORY_CARE_PROVIDER_SITE_OTHER): Payer: 59 | Admitting: Family Medicine

## 2017-02-01 VITALS — BP 135/72 | HR 68 | Ht 72.0 in | Wt 285.0 lb

## 2017-02-01 DIAGNOSIS — I10 Essential (primary) hypertension: Secondary | ICD-10-CM

## 2017-02-01 DIAGNOSIS — Z23 Encounter for immunization: Secondary | ICD-10-CM | POA: Diagnosis not present

## 2017-02-01 MED ORDER — ENALAPRIL MALEATE 20 MG PO TABS: 20.0000 mg | ORAL_TABLET | Freq: Every day | ORAL | 1 refills | Status: DC

## 2017-02-01 NOTE — Progress Notes (Signed)
Subjective:    CC: BP check.   HPI: Hypertension- Pt denies chest pain, SOB, dizziness, or heart palpitations.  Taking meds as directed w/o problems.  Denies medication side effects.    He lost his job at [employer] since I last saw but he has a new job now.     Past medical history, Surgical history, Family history not pertinant except as noted below, Social history, Allergies, and medications have been entered into the medical record, reviewed, and corrections made.   Review of Systems: No fevers, chills, night sweats, weight loss, chest pain, or shortness of breath.   Objective:    General: Well Developed, well nourished, and in no acute distress.  Neuro: Alert and oriented x3, extra-ocular muscles intact, sensation grossly intact.  HEENT: Normocephalic, atraumatic  Skin: Warm and dry, no rashes. Cardiac: Regular rate and rhythm, no murmurs rubs or gallops, no lower extremity edema.  Respiratory: Clear to auscultation bilaterally. Not using accessory muscles, speaking in full sentences.   Impression and Recommendations:    HTN - Well controlled. Continue current regimen. Follow up in  6 months. Due for CMP and lipids.  Refill sent ot pharmacy.    Tdap given today.

## 2017-02-13 LAB — COMPLETE METABOLIC PANEL WITH GFR
AG Ratio: 1.8 (calc) (ref 1.0–2.5)
ALT: 18 U/L (ref 9–46)
AST: 22 U/L (ref 10–40)
Albumin: 4.8 g/dL (ref 3.6–5.1)
Alkaline phosphatase (APISO): 71 U/L (ref 40–115)
BUN: 20 mg/dL (ref 7–25)
CO2: 29 mmol/L (ref 20–32)
Calcium: 9.8 mg/dL (ref 8.6–10.3)
Chloride: 105 mmol/L (ref 98–110)
Creat: 1.03 mg/dL (ref 0.60–1.35)
GFR, Est African American: 109 mL/min/{1.73_m2} (ref >=60)
GFR, Est Non African American: 94 mL/min/{1.73_m2} (ref >=60)
Globulin: 2.6 g/dL (calc) (ref 1.9–3.7)
Glucose, Bld: 90 mg/dL (ref 65–99)
Potassium: 4.4 mmol/L (ref 3.5–5.3)
Sodium: 141 mmol/L (ref 135–146)
Total Bilirubin: 1.1 mg/dL (ref 0.2–1.2)
Total Protein: 7.4 g/dL (ref 6.1–8.1)

## 2017-02-13 LAB — LIPID PANEL W/REFLEX DIRECT LDL
Cholesterol: 211 mg/dL — ABNORMAL HIGH (ref <200)
HDL: 34 mg/dL — ABNORMAL LOW (ref >40)
LDL Cholesterol (Calc): 143 mg/dL (calc) — ABNORMAL HIGH
Non-HDL Cholesterol (Calc): 177 mg/dL (calc) — ABNORMAL HIGH (ref <130)
Total CHOL/HDL Ratio: 6.2 (calc) — ABNORMAL HIGH (ref <5.0)
Triglycerides: 203 mg/dL — ABNORMAL HIGH (ref <150)

## 2017-08-01 ENCOUNTER — Ambulatory Visit: Payer: 59 | Admitting: Family Medicine

## 2017-08-09 ENCOUNTER — Other Ambulatory Visit: Payer: Self-pay | Admitting: Family Medicine

## 2017-08-09 DIAGNOSIS — I10 Essential (primary) hypertension: Secondary | ICD-10-CM

## 2017-08-29 ENCOUNTER — Encounter: Payer: Self-pay | Admitting: Family Medicine

## 2017-08-29 ENCOUNTER — Ambulatory Visit (INDEPENDENT_AMBULATORY_CARE_PROVIDER_SITE_OTHER): Payer: BLUE CROSS/BLUE SHIELD | Admitting: Family Medicine

## 2017-08-29 VITALS — BP 132/80 | HR 65 | Ht 72.0 in | Wt 249.0 lb

## 2017-08-29 DIAGNOSIS — Z23 Encounter for immunization: Secondary | ICD-10-CM

## 2017-08-29 DIAGNOSIS — I10 Essential (primary) hypertension: Secondary | ICD-10-CM | POA: Diagnosis not present

## 2017-08-29 DIAGNOSIS — G4733 Obstructive sleep apnea (adult) (pediatric): Secondary | ICD-10-CM

## 2017-08-29 DIAGNOSIS — Z6833 Body mass index (BMI) 33.0-33.9, adult: Secondary | ICD-10-CM | POA: Diagnosis not present

## 2017-08-29 MED ORDER — ENALAPRIL MALEATE 20 MG PO TABS: 20.0000 mg | ORAL_TABLET | Freq: Every day | ORAL | 3 refills | Status: DC

## 2017-08-29 NOTE — Progress Notes (Signed)
   Subjective:    Patient ID: Louis Phillips, male    DOB: 04/05/1980, 37 y.o.   MRN: [id number]  HPI Hypertension- Pt denies chest pain, SOB, dizziness, or heart palpitations.  Taking meds as directed w/o problems.  Denies medication side effects.     Obesity-he is actually done fantastic and lost about 40 pounds since he was last here.  He states his been gradually cutting out soda and tea.  He is also been eating a lower calorie low-carb diet.  He says last time he lost a lot of weight he gained it right back.  He thinks he did it too quickly but this time he is been working on it very gradually and trying to increase his activity levels.  Review of Systems     Objective:   Physical Exam  Constitutional: He is oriented to person, place, and time. He appears well-developed and well-nourished.  HENT:  Head: Normocephalic and atraumatic.  Cardiovascular: Normal rate, regular rhythm and normal heart sounds.  Pulmonary/Chest: Effort normal and breath sounds normal.  Neurological: He is alert and oriented to person, place, and time.  Skin: Skin is warm and dry.  Psychiatric: He has a normal mood and affect. His behavior is normal.        Assessment & Plan:  HTN - Well controlled. Continue current regimen. Follow up in  6 months for CPE.    Obesity/BMI 33-he is done absolutely fantastic and has lost about 40 pounds.  Continue with current dietary changes and increased activity level.  He is hoping to be closer to about 200 pounds when he comes back in 6 months.

## 2017-10-08 IMAGING — DX DG FOOT COMPLETE 3+V*L*
3 series · 3 of 3 positions shown · non-contrast
Comparison: None.

CLINICAL DATA: Left foot pain for 1.5 weeks. Pain is on the plantar
surface of the foot at the great toe.

EXAM:
LEFT FOOT - COMPLETE 3+ VIEW

[foot ap]
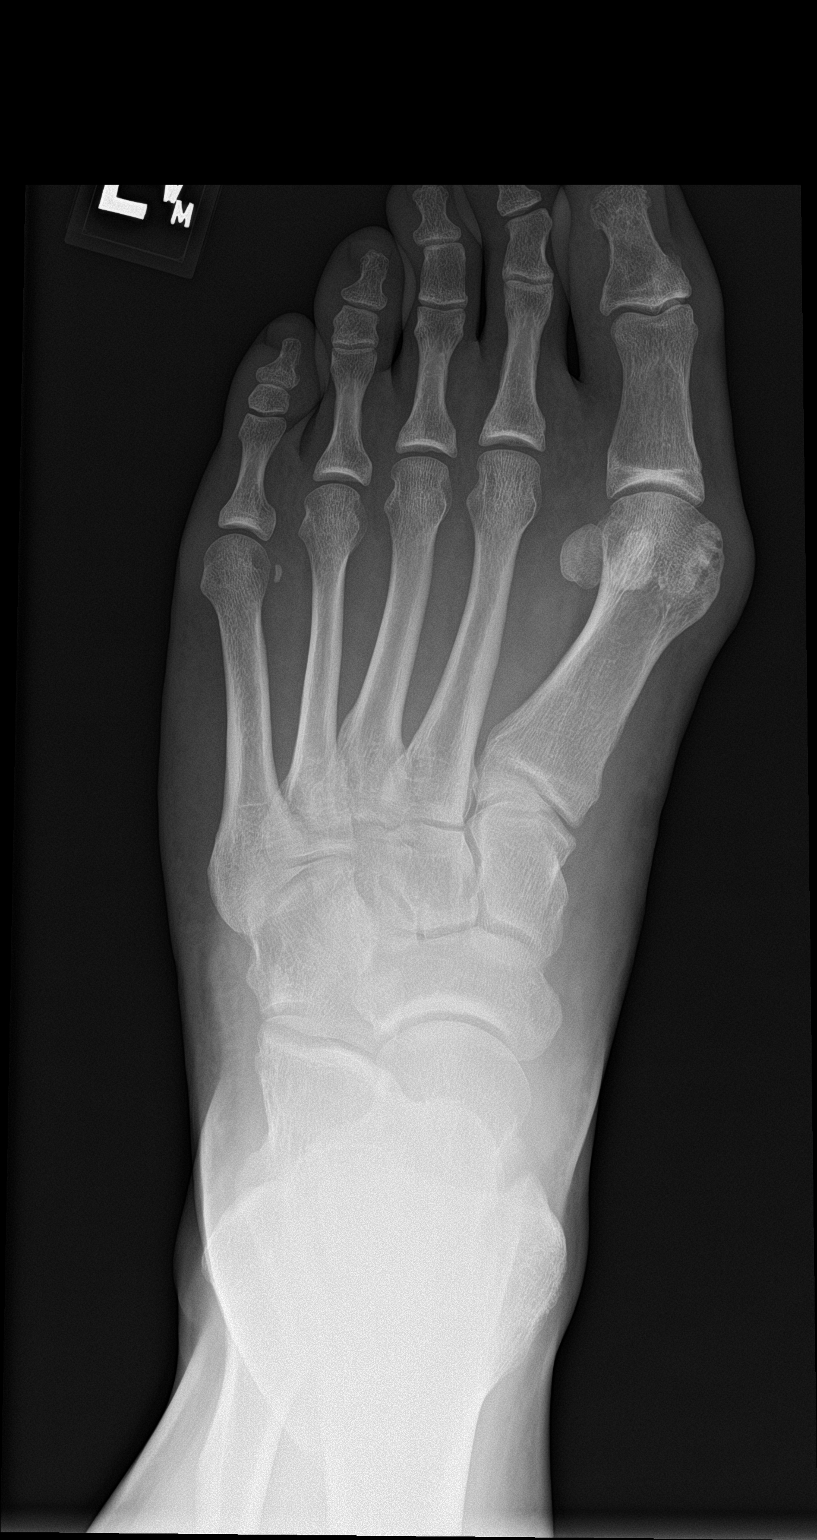

[foot obl]
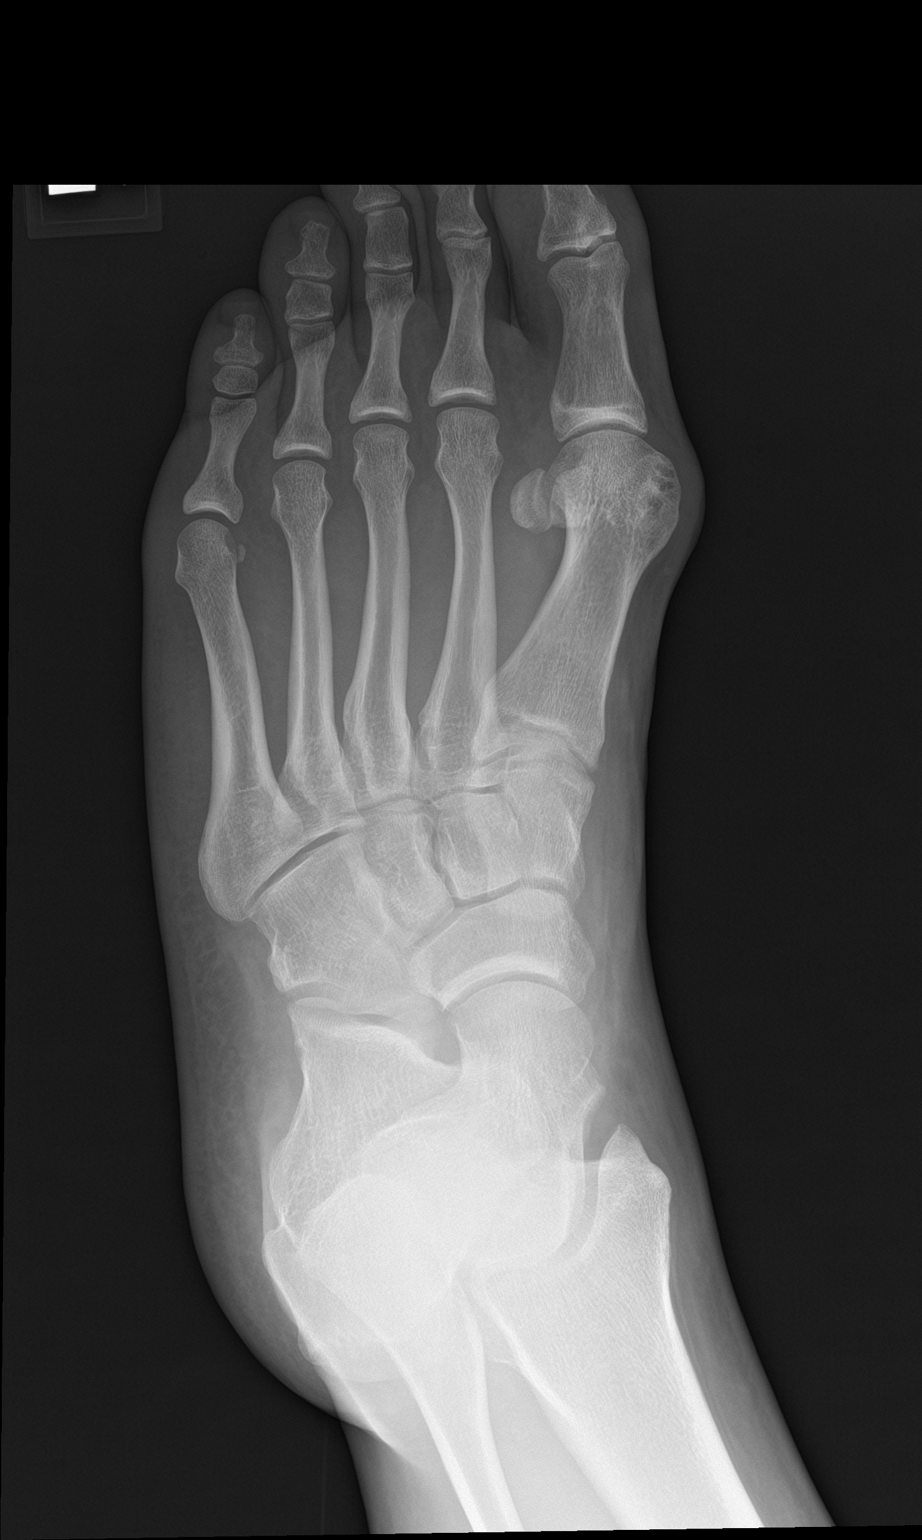

[foot lat]
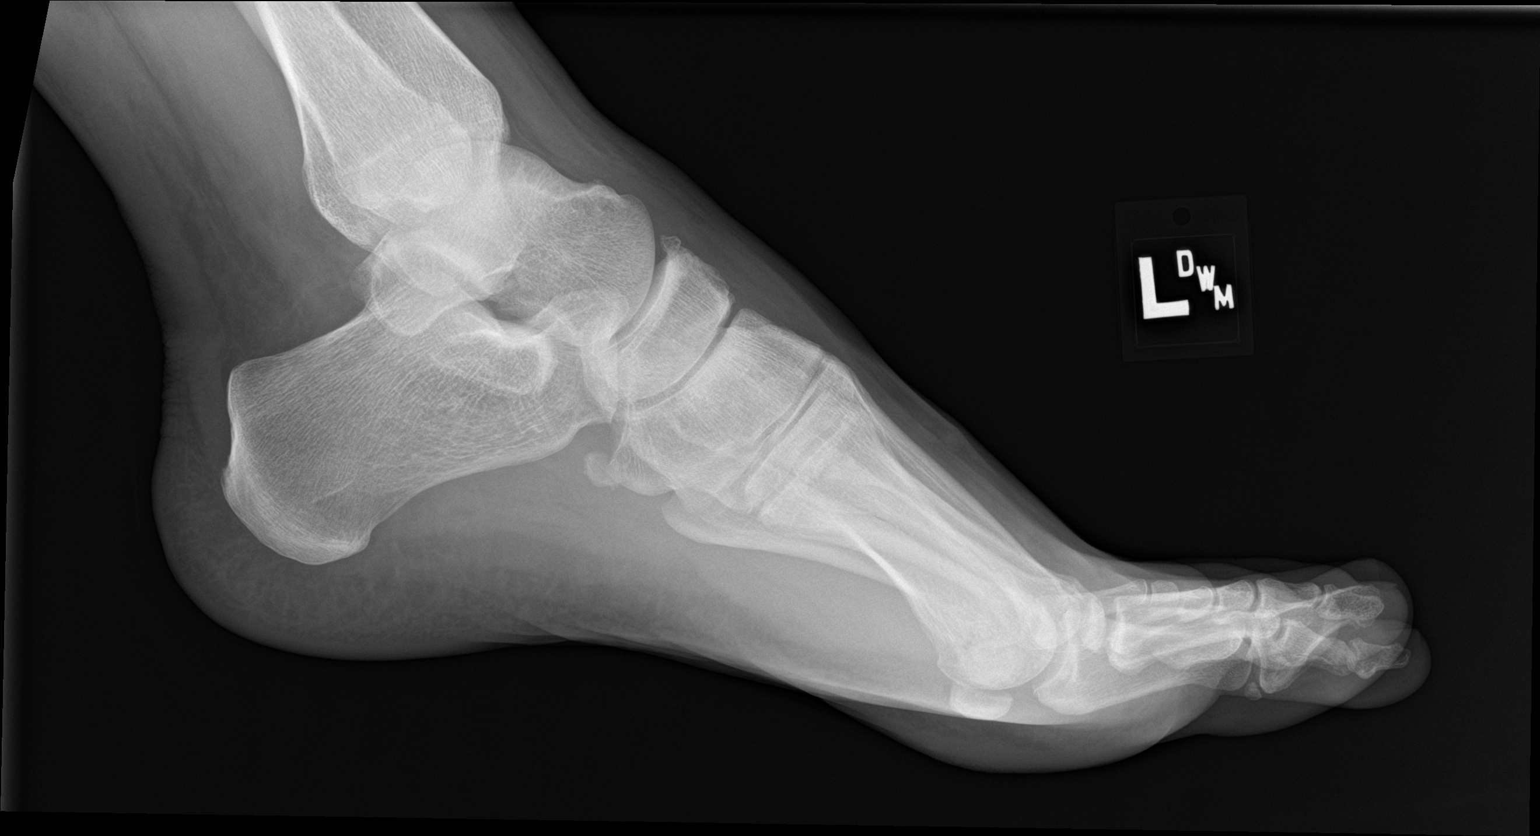

[3 of 3 positions shown; findings below may reference images not displayed]

FINDINGS: No acute bony or joint abnormality is seen. Hallux valgus deformity
is noted. No evidence of arthropathy. Soft tissues are unremarkable.
IMPRESSION: Hallux valgus.  Otherwise negative.

## 2018-02-28 ENCOUNTER — Encounter: Payer: BLUE CROSS/BLUE SHIELD | Admitting: Family Medicine

## 2018-02-28 NOTE — Progress Notes (Deleted)
[facility] - Established Patient Office Visit  Subjective:  Patient ID: Louis Phillips, male    DOB: 1980/10/17  Age: 38 y.o. MRN: [id number]  CC: No chief complaint on file.   HPI Luiz Iron presents for CPE  Past Medical History:  Diagnosis Date  . Anxiety   . Hypertension     Past Surgical History:  Procedure Laterality Date  . BACK SURGERY    . TONSILLECTOMY  38 yrs old    Family History  Problem Relation Age of Onset  . Diabetes Father   . Hypertension Father   . Hyperlipidemia Father   . Colon cancer Father 29  . Rheum arthritis Father   . Diabetes Mother   . Hyperlipidemia Mother   . Hypertension Mother   . Other Maternal Grandmother 55       AMI  . Heart disease Maternal Grandmother 69       AMI    Social History   Socioeconomic History  . Marital status: Married    Spouse name: Not on file  . Number of children: 1  . Years of education: Not on file  . Highest education level: Not on file  Occupational History    Employer: [employer]  Social Needs  . Financial resource strain: Not on file  . Food insecurity:    Worry: Not on file    Inability: Not on file  . Transportation needs:    Medical: Not on file    Non-medical: Not on file  Tobacco Use  . Smoking status: Never Smoker  . Smokeless tobacco: Never Used  Substance and Sexual Activity  . Alcohol use: Yes    Comment: socially  . Drug use: No  . Sexual activity: Not on file  Lifestyle  . Physical activity:    Days per week: Not on file    Minutes per session: Not on file  . Stress: Not on file  Relationships  . Social connections:    Talks on phone: Not on file    Gets together: Not on file    Attends religious service: Not on file    Active member of club or organization: Not on file    Attends meetings of clubs or organizations: Not on file    Relationship status: Not on file  . Intimate partner violence:    Fear of current or ex partner: Not on file    Emotionally abused:  Not on file    Physically abused: Not on file    Forced sexual activity: Not on file  Other Topics Concern  . Not on file  Social History Narrative  . Not on file    Outpatient Medications Prior to Visit  Medication Sig Dispense Refill  . enalapril (VASOTEC) 20 MG tablet Take 1 tablet (20 mg total) by mouth daily. Due for follow up visit w/PCP 90 tablet 3   No facility-administered medications prior to visit.     No Known Allergies  ROS Review of Systems    Objective:    Physical Exam  There were no vitals taken for this visit. Wt Readings from Last 3 Encounters:  08/29/17 249 lb (112.9 kg)  02/01/17 285 lb (129.3 kg)  03/23/16 250 lb 1.9 oz (113.5 kg)     Health Maintenance Due  Topic Date Due  . HIV Screening  10/01/1997    There are no preventive care reminders to display for this patient.  Lab Results  Component Value Date  TSH 2.980 12/03/2014   Lab Results  Component Value Date   WBC 7.3 12/03/2014   HGB 15.0 12/03/2014   HCT 43.9 12/03/2014   MCV 91.8 12/03/2014   PLT 243 12/03/2014   Lab Results  Component Value Date   NA 141 02/12/2017   K 4.4 02/12/2017   CO2 29 02/12/2017   GLUCOSE 90 02/12/2017   BUN 20 02/12/2017   CREATININE 1.03 02/12/2017   BILITOT 1.1 02/12/2017   ALKPHOS 55 08/29/2015   AST 22 02/12/2017   ALT 18 02/12/2017   PROT 7.4 02/12/2017   ALBUMIN 4.6 08/29/2015   CALCIUM 9.8 02/12/2017   Lab Results  Component Value Date   CHOL 211 (H) 02/12/2017   Lab Results  Component Value Date   HDL 34 (L) 02/12/2017   Lab Results  Component Value Date   LDLCALC 143 (H) 02/12/2017   Lab Results  Component Value Date   TRIG 203 (H) 02/12/2017   Lab Results  Component Value Date   CHOLHDL 6.2 (H) 02/12/2017   Lab Results  Component Value Date   HGBA1C 5.0 03/12/2014      Assessment & Plan:   Problem List Items Addressed This Visit    None    Visit Diagnoses    Routine general medical examination at a  health care facility    -  Primary      No orders of the defined types were placed in this encounter.   Follow-up: No follow-ups on file.    Nani Gasser, MD

## 2018-03-12 ENCOUNTER — Encounter: Payer: Self-pay | Admitting: *Deleted

## 2018-03-12 ENCOUNTER — Emergency Department
Admission: EM | Admit: 2018-03-12 | Discharge: 2018-03-12 | Disposition: A | Discharge status: Home / Self Care | Payer: BLUE CROSS/BLUE SHIELD | Source: Home / Self Care

## 2018-03-12 ENCOUNTER — Other Ambulatory Visit: Payer: Self-pay

## 2018-03-12 DIAGNOSIS — J069 Acute upper respiratory infection, unspecified: Secondary | ICD-10-CM

## 2018-03-12 DIAGNOSIS — B9789 Other viral agents as the cause of diseases classified elsewhere: Secondary | ICD-10-CM | POA: Diagnosis not present

## 2018-03-12 MED ORDER — BENZONATATE 100 MG PO CAPS: 100.0000 mg | ORAL_CAPSULE | Freq: Three times a day (TID) | ORAL | 0 refills | Status: AC

## 2018-03-12 NOTE — Discharge Instructions (Signed)
  You may take 500mg acetaminophen every 4-6 hours or in combination with ibuprofen 400-600mg every 6-8 hours as needed for pain, inflammation, and fever.  Be sure to well hydrated with clear liquids and get at least 8 hours of sleep at night, preferably more while sick.   Please follow up with family medicine in 1 week if needed.   

## 2018-03-12 NOTE — ED Triage Notes (Signed)
Pt c/o cough, fever, and chills x 4 days. Wife tested positive for flu A.

## 2018-03-12 NOTE — ED Provider Notes (Signed)
[facility]    CSN: [id number] Arrival date & time: 03/12/18  1612     History   Chief Complaint Chief Complaint  Patient presents with  . Fever  . Cough    HPI PILAR CORRALES is a 38 y.o. male.   HPI JARVIN OGREN is a 38 y.o. male presenting to UC with c/o 4 days of cough, mild congestion, fever, chills. His wife tested positive for Flu A a few days ago. Denies chest pain or SOB. Denies n/v/d. No medication taken PTA.   Past Medical History:  Diagnosis Date  . Anxiety   . Hypertension     Patient Active Problem List   Diagnosis Date Noted  . OSA (obstructive sleep apnea) 08/29/2015  . Obesity 07/29/2013  . HYPERLIPIDEMIA 10/08/2010  . Essential hypertension, benign 05/26/2010  . Depressive disorder, not elsewhere classified 05/26/2010  . BACK PAIN, LUMBAR 02/04/2009  . CARPAL TUNNEL SYNDROME, LEFT 03/09/2008  . ANXIETY 11/14/2007    Past Surgical History:  Procedure Laterality Date  . BACK SURGERY    . TONSILLECTOMY  38 yrs old       Home Medications    Prior to Admission medications   Medication Sig Start Date End Date Taking? Authorizing Provider  benzonatate (TESSALON) 100 MG capsule Take 1-2 capsules (100-200 mg total) by mouth every 8 (eight) hours. 03/12/18   Lurene Shadow, PA-C  enalapril (VASOTEC) 20 MG tablet Take 1 tablet (20 mg total) by mouth daily. Due for follow up visit w/PCP 08/29/17   Agapito Games, MD  omeprazole (PRILOSEC OTC) 20 MG tablet Take 20 mg by mouth daily.  04/25/12  [provider]    Family History Family History  Problem Relation Age of Onset  . Diabetes Father   . Hypertension Father   . Hyperlipidemia Father   . Colon cancer Father 76  . Rheum arthritis Father   . Diabetes Mother   . Hyperlipidemia Mother   . Hypertension Mother   . Other Maternal Grandmother 55       AMI  . Heart disease Maternal Grandmother 49       AMI    Social History Social History   Tobacco Use  .  Smoking status: Never Smoker  . Smokeless tobacco: Never Used  Substance Use Topics  . Alcohol use: Yes    Comment: socially  . Drug use: No     Allergies   Patient has no known allergies.   Review of Systems Review of Systems  Constitutional: Positive for chills and fever ( subjective).  HENT: Positive for congestion and sore throat. Negative for ear pain, trouble swallowing and voice change.   Respiratory: Positive for cough. Negative for shortness of breath.   Cardiovascular: Negative for chest pain and palpitations.  Gastrointestinal: Negative for abdominal pain, diarrhea, nausea and vomiting.  Musculoskeletal: Positive for arthralgias and myalgias. Negative for back pain.  Skin: Negative for rash.     Physical Exam Triage Vital Signs ED Triage Vitals  Enc Vitals Group     BP 03/12/18 1634 139/89     Pulse Rate 03/12/18 1634 75     Resp 03/12/18 1634 18     Temp 03/12/18 1634 98.5 F (36.9 C)     Temp Source 03/12/18 1634 Oral     SpO2 03/12/18 1634 96 %     Weight 03/12/18 1637 284 lb (128.8 kg)     Height 03/12/18 1637  (1.854 m)  Head Circumference --      Peak Flow --      Pain Score 03/12/18 1635 0     Pain Loc --      Pain Edu? --      Excl. in GC? --    No data found.  Updated Vital Signs BP 139/89 (BP Location: Right Arm)   Pulse 75   Temp 98.5 F (36.9 C) (Oral)   Resp 18   Ht 6\' 1"  (1.854 m)   Wt 284 lb (128.8 kg)   SpO2 96%   BMI 37.47 kg/m   Visual Acuity Right Eye Distance:   Left Eye Distance:   Bilateral Distance:    Right Eye Near:   Left Eye Near:    Bilateral Near:     Physical Exam Vitals signs and nursing note reviewed.  Constitutional:      Appearance: Normal appearance. He is well-developed.  HENT:     Head: Normocephalic and atraumatic.     Right Ear: Tympanic membrane normal.     Left Ear: Tympanic membrane normal.     Nose: Nose normal.     Right Sinus: No maxillary sinus tenderness or frontal sinus  tenderness.     Left Sinus: No maxillary sinus tenderness or frontal sinus tenderness.     Mouth/Throat:     Lips: Pink.     Mouth: Mucous membranes are moist.     Pharynx: Oropharynx is clear. Uvula midline.  Neck:     Musculoskeletal: Normal range of motion.  Cardiovascular:     Rate and Rhythm: Normal rate and regular rhythm.  Pulmonary:     Effort: Pulmonary effort is normal. No respiratory distress.     Breath sounds: Normal breath sounds. No stridor. No wheezing or rhonchi.  Musculoskeletal: Normal range of motion.  Skin:    General: Skin is warm and dry.  Neurological:     Mental Status: He is alert and oriented to person, place, and time.  Psychiatric:        Behavior: Behavior normal.      UC Treatments / Results  Labs (all labs ordered are listed, but only abnormal results are displayed) Labs Reviewed - No data to display  EKG None  Radiology No results found.  Procedures Procedures (including critical care time)  Medications Ordered in UC Medications - No data to display  Initial Impression / Assessment and Plan / UC Course  I have reviewed the triage vital signs and the nursing notes.  Pertinent labs & imaging results that were available during my care of the patient were reviewed by me and considered in my medical decision making (see chart for details).     No evidence of bacterial infection at this time Advised pt Tamiflu would not be of benefit 4 days out from symptom onset Encouraged conservative tx  F/u with PCP as needed  Final Clinical Impressions(s) / UC Diagnoses   Final diagnoses:  Viral URI with cough     Discharge Instructions      You may take 500mg  acetaminophen every 4-6 hours or in combination with ibuprofen 400-600mg  every 6-8 hours as needed for pain, inflammation, and fever.  Be sure to well hydrated with clear liquids and get at least 8 hours of sleep at night, preferably more while sick.   Please follow up with  family medicine in 1 week if needed.     ED Prescriptions    Medication Sig Dispense Auth. Provider   benzonatate (TESSALON)  100 MG capsule Take 1-2 capsules (100-200 mg total) by mouth every 8 (eight) hours. 21 capsule Lurene Shadow, PA-C     Controlled Substance Prescriptions [place] Controlled Substance Registry consulted? Not Applicable   Rolla Plate 03/13/18 1311

## 2018-04-01 ENCOUNTER — Encounter: Payer: BLUE CROSS/BLUE SHIELD | Admitting: Family Medicine

## 2018-10-04 ENCOUNTER — Other Ambulatory Visit: Payer: Self-pay | Admitting: Family Medicine

## 2018-10-04 DIAGNOSIS — I10 Essential (primary) hypertension: Secondary | ICD-10-CM

## 2018-10-31 ENCOUNTER — Other Ambulatory Visit: Payer: Self-pay | Admitting: Family Medicine

## 2018-10-31 DIAGNOSIS — I10 Essential (primary) hypertension: Secondary | ICD-10-CM

## 2018-12-19 DIAGNOSIS — Z20828 Contact with and (suspected) exposure to other viral communicable diseases: Secondary | ICD-10-CM | POA: Diagnosis not present

## 2018-12-19 DIAGNOSIS — R509 Fever, unspecified: Secondary | ICD-10-CM | POA: Diagnosis not present

## 2019-01-06 ENCOUNTER — Other Ambulatory Visit: Payer: Self-pay | Admitting: Family Medicine

## 2019-01-06 DIAGNOSIS — I10 Essential (primary) hypertension: Secondary | ICD-10-CM

## 2020-12-20 ENCOUNTER — Other Ambulatory Visit: Payer: Self-pay

## 2020-12-20 ENCOUNTER — Emergency Department (INDEPENDENT_AMBULATORY_CARE_PROVIDER_SITE_OTHER)
Admission: EM | Admit: 2020-12-20 | Discharge: 2020-12-20 | Disposition: A | Discharge status: Home / Self Care | Payer: BC Managed Care – PPO | Source: Home / Self Care | Attending: Family Medicine | Admitting: Family Medicine

## 2020-12-20 ENCOUNTER — Emergency Department (INDEPENDENT_AMBULATORY_CARE_PROVIDER_SITE_OTHER): Payer: BC Managed Care – PPO

## 2020-12-20 DIAGNOSIS — J069 Acute upper respiratory infection, unspecified: Secondary | ICD-10-CM

## 2020-12-20 DIAGNOSIS — H6693 Otitis media, unspecified, bilateral: Secondary | ICD-10-CM | POA: Diagnosis not present

## 2020-12-20 DIAGNOSIS — R062 Wheezing: Secondary | ICD-10-CM

## 2020-12-20 DIAGNOSIS — R059 Cough, unspecified: Secondary | ICD-10-CM

## 2020-12-20 LAB — POC SARS CORONAVIRUS 2 AG -  ED: SARS Coronavirus 2 Ag: NEGATIVE

## 2020-12-20 LAB — POCT INFLUENZA A/B
Influenza A, POC: NEGATIVE
Influenza B, POC: NEGATIVE

## 2020-12-20 MED ORDER — AMOXICILLIN 875 MG PO TABS: 875.0000 mg | ORAL_TABLET | Freq: Two times a day (BID) | ORAL | 0 refills | Status: AC

## 2020-12-20 NOTE — Discharge Instructions (Signed)
Take plain guaifenesin (1200mg extended release tabs such as Mucinex) twice daily, with plenty of water, for cough and congestion.  May add Pseudoephedrine (30mg, one or two every 4 to 6 hours) for sinus congestion.  Get adequate rest.   May use Afrin nasal spray (or generic oxymetazoline) each morning for about 5 days and then discontinue.  Also recommend using saline nasal spray several times daily and saline nasal irrigation (AYR is a common brand).  Use Flonase nasal spray each morning after using Afrin nasal spray and saline nasal irrigation. Try warm salt water gargles for sore throat.  Stop all antihistamines for now, and other non-prescription cough/cold preparations. May take Delsym Cough Suppressant ("12 Hour Cough Relief") at bedtime for nighttime cough.   If your COVID-19 test is positive, isolate yourself for five days from today.  At the end of five days you may end isolation if your symptoms have cleared or improved, and you have not had a fever for 24 hours. At this time you should wear a mask for five more days when you are around others.   If symptoms become significantly worse during the night or over the weekend, proceed to the local emergency room.      

## 2020-12-20 NOTE — ED Triage Notes (Signed)
Pt states that he has a cough, chest congestion, ear pressure, facial pressure and wheezing at night. X2-3 days   Pt states that he isn't vaccinated for covid.  Pt states that he hasn't had flu vaccine.

## 2020-12-20 NOTE — ED Provider Notes (Signed)
[facility]    CSN: [id number] Arrival date & time: 12/20/20  1027      History   Chief Complaint Chief Complaint  Patient presents with   Cough    Cough, chest congestion, ear pressure, facial pressure, wheezing at night. X2-3 days    HPI [facility] is a 40 y.o. male.   Patient complains of four day history of typical cold-like symptoms developing over several days, including sinus congestion, fatigue, ear pressure, and non-productive cough.  He feels as if he has had wheezing in his left chest but denies shortness of breath and pleuritic pain.  The history is provided by the patient.   Past Medical History:  Diagnosis Date   Anxiety    Hypertension     Patient Active Problem List   Diagnosis Date Noted   OSA (obstructive sleep apnea) 08/29/2015   Obesity 07/29/2013   HYPERLIPIDEMIA 10/08/2010   Essential hypertension, benign 05/26/2010   Depressive disorder, not elsewhere classified 05/26/2010   BACK PAIN, LUMBAR 02/04/2009   CARPAL TUNNEL SYNDROME, LEFT 03/09/2008   ANXIETY 11/14/2007    Past Surgical History:  Procedure Laterality Date   BACK SURGERY     TONSILLECTOMY  40 yrs old       Home Medications    Prior to Admission medications   Medication Sig Start Date End Date Taking? Authorizing Provider  amoxicillin (AMOXIL) 875 MG tablet Take 1 tablet (875 mg total) by mouth 2 (two) times daily. 12/20/20  Yes Kandra Nicolas, MD  atorvastatin (LIPITOR) 10 MG tablet Take 10 mg by mouth daily.   Yes [provider]  lisinopril (ZESTRIL) 2.5 MG tablet Take 2.5 mg by mouth daily.   Yes [provider]  benzonatate (TESSALON) 100 MG capsule Take 1-2 capsules (100-200 mg total) by mouth every 8 (eight) hours. 03/12/18   Noe Gens, PA-C  enalapril (VASOTEC) 20 MG tablet Take 1 tablet (20 mg total) by mouth daily. 2 WEEK SUPPLY GIVEN.LAST REFILL.MUST SCHEDULE/KEEP APPOINTMENT FOR REFILLS 10/31/18   Hali Marry, MD   omeprazole (PRILOSEC OTC) 20 MG tablet Take 20 mg by mouth daily.  04/25/12  [provider]    Family History Family History  Problem Relation Age of Onset   Diabetes Father    Hypertension Father    Hyperlipidemia Father    Colon cancer Father 99   Rheum arthritis Father    Diabetes Mother    Hyperlipidemia Mother    Hypertension Mother    Other Maternal Grandmother 30       AMI   Heart disease Maternal Grandmother 55       AMI    Social History Social History   Tobacco Use   Smoking status: Never   Smokeless tobacco: Never  Vaping Use   Vaping Use: Never used  Substance Use Topics   Alcohol use: Yes    Comment: socially   Drug use: No     Allergies   Patient has no known allergies.   Review of Systems Review of Systems No sore throat + cough No pleuritic pain ? wheezing + nasal congestion + post-nasal drainage + sinus pain/pressure No itchy/red eyes ? earache No hemoptysis No SOB No fever/chills No nausea No vomiting No abdominal pain + diarrhea (resolved) No urinary symptoms No skin rash + fatigue No myalgias No headache Used OTC meds (Mucinex) without relief   Physical Exam Triage Vital Signs ED Triage Vitals  Enc Vitals Group  BP 12/20/20 1326 (!) 144/85     Pulse Rate 12/20/20 1326 73     Resp 12/20/20 1326 18     Temp 12/20/20 1326 98.3 F (36.8 C)     Temp Source 12/20/20 1326 Oral     SpO2 12/20/20 1326 97 %     Weight 12/20/20 1324 (!) 318 lb (144.2 kg)     Height 12/20/20 1324 6\' 1"  (1.854 m)     Head Circumference --      Peak Flow --      Pain Score 12/20/20 1324 6     Pain Loc --      Pain Edu? --      Excl. in GC? --    No data found.  Updated Vital Signs BP (!) 144/85 (BP Location: Left Arm)    Pulse 73    Temp 98.3 F (36.8 C) (Oral)    Resp 18    Ht 6\' 1"  (1.854 m)    Wt (!) 144.2 kg    SpO2 97%    BMI 41.96 kg/m   Visual Acuity Right Eye Distance:   Left Eye Distance:   Bilateral Distance:     Right Eye Near:   Left Eye Near:    Bilateral Near:     Physical Exam Nursing notes and Vital Signs reviewed. Appearance:  Patient appears stated age, and in no acute distress Eyes:  Pupils are equal, round, and reactive to light and accomodation.  Extraocular movement is intact.  Conjunctivae are not inflamed  Ears:  Canals normal.  Tympanic membranes are erythematous bilaterally. Nose:  Mildly congested turbinates.  No sinus tenderness.   Pharynx:  Normal Neck:  Supple.  Mildly enlarged lateral nodes are present, tender to palpation on the left.   Lungs:  Faint wheeze left posterior chest.  Breath sounds are equal.  Moving air well. Heart:  Regular rate and rhythm without murmurs, rubs, or gallops.  Abdomen:  Nontender without masses or hepatosplenomegaly.  Bowel sounds are present.  No CVA or flank tenderness.  Extremities:  No edema.  Skin:  No rash present.   UC Treatments / Results  Labs (all labs ordered are listed, but only abnormal results are displayed) Labs Reviewed  SARS-COV-2 RNA,(COVID-19) QUALITATIVE NAAT  POCT INFLUENZA A/B negative  POC SARS CORONAVIRUS 2 AG -  ED negative    EKG   Radiology DG Chest 2 View  Result Date: 12/20/2020 CLINICAL DATA:  Cough and wheezing. EXAM: CHEST - 2 VIEW COMPARISON:  06/06/2020 from [facility] FINDINGS: Mild right hemidiaphragm elevation. Midline trachea. Borderline cardiomegaly. Mediastinal contours otherwise within normal limits. No pleural effusion or pneumothorax. Clear lungs. IMPRESSION: No acute cardiopulmonary disease. Electronically Signed   By: 12/22/2020 M.D.   On: 12/20/2020 14:32    Procedures Procedures (including critical care time)  Medications Ordered in UC Medications - No data to display  Initial Impression / Assessment and Plan / UC Course  I have reviewed the triage vital signs and the nursing notes.  Pertinent labs & imaging results that were available during my care of the patient were  reviewed by me and considered in my medical decision making (see chart for details).    Begin amoxicillin. COVID19 PCR pending. Followup with [facility] if not improved in one week.   Final Clinical Impressions(s) / UC Diagnoses   Final diagnoses:  Viral URI with cough  Bilateral acute otitis media     Discharge Instructions  Take plain guaifenesin (1200mg  extended release tabs such as Mucinex) twice daily, with plenty of water, for cough and congestion.  May add Pseudoephedrine (30mg , one or two every 4 to 6 hours) for sinus congestion.  Get adequate rest.   May use Afrin nasal spray (or generic oxymetazoline) each morning for about 5 days and then discontinue.  Also recommend using saline nasal spray several times daily and saline nasal irrigation (AYR is a common brand).  Use Flonase nasal spray each morning after using Afrin nasal spray and saline nasal irrigation. Try warm salt water gargles for sore throat.  Stop all antihistamines for now, and other non-prescription cough/cold preparations. May take Delsym Cough Suppressant ("12 Hour Cough Relief") at bedtime for nighttime cough.   If your COVID-19 test is positive, isolate yourself for five days from today.  At the end of five days you may end isolation if your symptoms have cleared or improved, and you have not had a fever for 24 hours. At this time you should wear a mask for five more days when you are around others.   If symptoms become significantly worse during the night or over the weekend, proceed to the local emergency room.         ED Prescriptions     Medication Sig Dispense Auth. Provider   amoxicillin (AMOXIL) 875 MG tablet Take 1 tablet (875 mg total) by mouth 2 (two) times daily. 20 tablet Kandra Nicolas, MD         Kandra Nicolas, MD 12/22/20 [phone]

## 2020-12-21 LAB — SARS-COV-2 RNA,(COVID-19) QUALITATIVE NAAT: SARS CoV2 RNA: NOT DETECTED

## 2021-03-30 ENCOUNTER — Other Ambulatory Visit (HOSPITAL_BASED_OUTPATIENT_CLINIC_OR_DEPARTMENT_OTHER): Payer: Self-pay

## 2021-03-30 DIAGNOSIS — R0683 Snoring: Secondary | ICD-10-CM
